# Patient Record
Sex: Male | Born: 1970 | Race: White | Hispanic: No | Marital: Married | Smoking: Never smoker
Health system: Southern US, Community
[De-identification: ages and names within clinical notes are randomized; demographics above are authoritative.]

## PROBLEM LIST (undated history)

## (undated) DIAGNOSIS — S42309A Unspecified fracture of shaft of humerus, unspecified arm, initial encounter for closed fracture: Secondary | ICD-10-CM

## (undated) DIAGNOSIS — T7840XA Allergy, unspecified, initial encounter: Secondary | ICD-10-CM

## (undated) DIAGNOSIS — J302 Other seasonal allergic rhinitis: Secondary | ICD-10-CM

## (undated) HISTORY — DX: Allergy, unspecified, initial encounter: T78.40XA

## (undated) HISTORY — DX: Unspecified fracture of shaft of humerus, unspecified arm, initial encounter for closed fracture: S42.309A

## (undated) HISTORY — DX: Other seasonal allergic rhinitis: J30.2

## (undated) HISTORY — PX: TONSILLECTOMY: SUR1361

---
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
## 1992-07-31 HISTORY — PX: RHINOPLASTY: SUR1284

## 2002-07-31 HISTORY — PX: VASECTOMY: SHX75

## 2012-07-26 ENCOUNTER — Ambulatory Visit: Payer: Self-pay | Admitting: Internal Medicine

## 2012-08-23 ENCOUNTER — Ambulatory Visit (INDEPENDENT_AMBULATORY_CARE_PROVIDER_SITE_OTHER): Payer: BC Managed Care – PPO | Admitting: Internal Medicine

## 2012-08-23 ENCOUNTER — Encounter: Payer: Self-pay | Admitting: Internal Medicine

## 2012-08-23 VITALS — BP 130/82 | HR 72 | Temp 98.2°F | Resp 18 | Ht 65.5 in | Wt 208.0 lb

## 2012-08-23 DIAGNOSIS — M25561 Pain in right knee: Secondary | ICD-10-CM

## 2012-08-23 DIAGNOSIS — Z0000 Encounter for general adult medical examination without abnormal findings: Secondary | ICD-10-CM

## 2012-08-23 DIAGNOSIS — R0609 Other forms of dyspnea: Secondary | ICD-10-CM

## 2012-08-23 DIAGNOSIS — M25569 Pain in unspecified knee: Secondary | ICD-10-CM

## 2012-08-23 DIAGNOSIS — R0683 Snoring: Secondary | ICD-10-CM | POA: Insufficient documentation

## 2012-08-23 DIAGNOSIS — R0989 Other specified symptoms and signs involving the circulatory and respiratory systems: Secondary | ICD-10-CM

## 2012-08-23 NOTE — Assessment & Plan Note (Signed)
Right lateral knee pain most consistent with IT band syndrome. Symptoms improving with massage and rest. Will continue to monitor. Pt will email with update. If no improvement, then will set up sports med eval.

## 2012-08-23 NOTE — Assessment & Plan Note (Signed)
Pt with extensive h/o nasal reconstruction, now with snoring during sleep. No daytime somnolence noted. Will set up sleep study for evaluation of sleep apnea.

## 2012-08-23 NOTE — Progress Notes (Signed)
Subjective:    Patient ID: Martin Lewis, male    DOB: 1970/08/28, 42 y.o.   MRN: [id number]  HPI 42 year old male presents to establish care. He reports he has been generally healthy. He has 2 concerns today. First, his wife has noticed force frequently when sleeping. He is unsure if he has apneic events. He denies any daytime somnolence however does note that he can fall asleep quickly during the day when at rest. He has never been evaluated for sleep apnea. He has a history of nasal reconstruction after nasal fracture.  He is also concerned about right lateral knee pain. Pain first began a couple of weeks ago. He notes he has been increasing his running workouts. He denies any specific injury to his knee. He is very active with running, basketball, and weights. pain is described as aching in the right lateral knee. It has improved with massage and topical anti-inflammatory medications. He denies any swelling in his leg, instability, weakness.   Outpatient Encounter Prescriptions as of 08/23/2012  Medication Sig Dispense Refill  . loratadine (CLARITIN) 10 MG tablet Take 10 mg by mouth daily.       BP 130/82  Pulse 72  Temp 98.2 F (36.8 C) (Oral)  Resp 18  Ht 5' 5.5" (1.664 m)  Wt 208 lb (94.348 kg)  BMI 34.09 kg/m2  SpO2 97%  Review of Systems  Constitutional: Negative for fever, chills, activity change, appetite change, fatigue and unexpected weight change.  Eyes: Negative for visual disturbance.  Respiratory: Negative for cough and shortness of breath.   Cardiovascular: Negative for chest pain, palpitations and leg swelling.  Gastrointestinal: Negative for abdominal pain and abdominal distention.  Genitourinary: Negative for dysuria, urgency and difficulty urinating.  Musculoskeletal: Positive for myalgias and arthralgias. Negative for gait problem.  Skin: Negative for color change and rash.  Hematological: Negative for adenopathy.  Psychiatric/Behavioral: Negative for sleep  disturbance and dysphoric mood. The patient is not nervous/anxious.        Objective:   Physical Exam  Constitutional: He is oriented to person, place, and time. He appears well-developed and well-nourished. No distress.  HENT:  Head: Normocephalic and atraumatic.  Right Ear: External ear normal.  Left Ear: External ear normal.  Nose: Nose normal.  Mouth/Throat: Oropharynx is clear and moist. No oropharyngeal exudate.  Eyes: Conjunctivae normal and EOM are normal. Pupils are equal, round, and reactive to light. Right eye exhibits no discharge. Left eye exhibits no discharge. No scleral icterus.  Neck: Normal range of motion. Neck supple. No tracheal deviation present. No thyromegaly present.  Cardiovascular: Normal rate, regular rhythm and normal heart sounds.  Exam reveals no gallop and no friction rub.   No murmur heard. Pulmonary/Chest: Effort normal and breath sounds normal. No respiratory distress. He has no wheezes. He has no rales. He exhibits no tenderness.  Abdominal: Soft. Bowel sounds are normal. He exhibits no distension. There is no tenderness.  Musculoskeletal: Normal range of motion. He exhibits no edema.       Right knee: He exhibits normal range of motion, no swelling, no LCL laxity, normal patellar mobility and no MCL laxity. tenderness found. Lateral joint line tenderness noted.       Legs: Lymphadenopathy:    He has no cervical adenopathy.  Neurological: He is alert and oriented to person, place, and time. No cranial nerve deficit. Coordination normal.  Skin: Skin is warm and dry. No rash noted. He is not diaphoretic. No erythema. No pallor.  Psychiatric: He  has a normal mood and affect. His behavior is normal. Judgment and thought content normal.          Assessment & Plan:

## 2012-08-26 ENCOUNTER — Other Ambulatory Visit (INDEPENDENT_AMBULATORY_CARE_PROVIDER_SITE_OTHER): Payer: BC Managed Care – PPO

## 2012-08-26 DIAGNOSIS — Z0000 Encounter for general adult medical examination without abnormal findings: Secondary | ICD-10-CM

## 2012-08-28 ENCOUNTER — Encounter: Payer: Self-pay | Admitting: Internal Medicine

## 2012-08-28 LAB — LIPID PANEL
Chol/HDL Ratio: 6.3 ratio units — ABNORMAL HIGH (ref 0.0–5.0)
HDL: 28 mg/dL — ABNORMAL LOW (ref >39)
LDL Calculated: 115 mg/dL — ABNORMAL HIGH (ref 0–99)
Triglycerides: 165 mg/dL — ABNORMAL HIGH (ref 0–149)
VLDL Cholesterol Cal: 33 mg/dL (ref 5–40)

## 2012-08-28 LAB — COMPREHENSIVE METABOLIC PANEL
ALT: 43 IU/L (ref 0–44)
AST: 29 IU/L (ref 0–40)
CO2: 24 mmol/L (ref 19–28)
Calcium: 9.1 mg/dL (ref 8.7–10.2)
Chloride: 101 mmol/L (ref 97–108)
Potassium: 3.9 mmol/L (ref 3.5–5.2)
Sodium: 139 mmol/L (ref 134–144)

## 2012-08-28 LAB — CBC WITH DIFFERENTIAL/PLATELET
Basophils Absolute: 0.1 10*3/uL (ref 0.0–0.2)
Basos: 1 % (ref 0–3)
Hemoglobin: 15.5 g/dL (ref 12.6–17.7)
Immature Grans (Abs): 0.1 10*3/uL (ref 0.0–0.1)
Lymphs: 37 % (ref 14–46)
MCHC: 34.8 g/dL (ref 31.5–35.7)
Monocytes: 6 % (ref 4–12)
Neutrophils Absolute: 2.6 10*3/uL (ref 1.4–7.0)
Neutrophils Relative %: 51 % (ref 40–74)
RBC: 5.07 x10E6/uL (ref 4.14–5.80)

## 2012-08-29 ENCOUNTER — Telehealth: Payer: Self-pay | Admitting: *Deleted

## 2012-08-29 NOTE — Telephone Encounter (Signed)
The a1c couldn't be added, it was threw [facility]

## 2012-08-29 NOTE — Telephone Encounter (Signed)
Can you please ask him to come back for an A1c?

## 2012-09-13 ENCOUNTER — Ambulatory Visit: Payer: BC Managed Care – PPO | Admitting: Internal Medicine

## 2012-09-14 ENCOUNTER — Other Ambulatory Visit: Payer: Self-pay

## 2012-09-17 ENCOUNTER — Encounter: Payer: Self-pay | Admitting: Internal Medicine

## 2012-09-17 ENCOUNTER — Ambulatory Visit (INDEPENDENT_AMBULATORY_CARE_PROVIDER_SITE_OTHER): Payer: BC Managed Care – PPO | Admitting: Internal Medicine

## 2012-09-17 VITALS — BP 128/78 | HR 68 | Temp 98.3°F | Ht 65.5 in | Wt 208.5 lb

## 2012-09-17 DIAGNOSIS — E669 Obesity, unspecified: Secondary | ICD-10-CM | POA: Insufficient documentation

## 2012-09-17 DIAGNOSIS — R7309 Other abnormal glucose: Secondary | ICD-10-CM

## 2012-09-17 DIAGNOSIS — E786 Lipoprotein deficiency: Secondary | ICD-10-CM

## 2012-09-17 DIAGNOSIS — R739 Hyperglycemia, unspecified: Secondary | ICD-10-CM | POA: Insufficient documentation

## 2012-09-17 NOTE — Patient Instructions (Signed)
[email or web address]

## 2012-09-17 NOTE — Assessment & Plan Note (Signed)
BG noted to be elevated on labs at 136, however not fasting. No h/o DM. Will check A1c with labs today.

## 2012-09-17 NOTE — Assessment & Plan Note (Signed)
Discussed recent cholesterol results showing low HDL and elevated triglycerides. Discussed metabolic syndrome. Recommended Mediterranean style diet and exercise 3x per week. Plan to repeat lipids in 1 year.

## 2012-09-17 NOTE — Assessment & Plan Note (Signed)
Body mass index is 34.16 kg/(m^2).  Discussed healthy nutrition, with foods high in fiber and low in saturated fat. Discussed goal of physical activity 3x per week. Encouraged keeping a food diary.

## 2012-09-17 NOTE — Progress Notes (Signed)
Subjective:    Patient ID: Martin Lewis, male    DOB: March 07, 1971, 42 y.o.   MRN: [id number]  HPI 42 year old male presents for followup after recent lab work showing elevated blood sugar. Blood sugar on recent labs was 136. Patient reports this was not fasting however. He has no personal history of diabetes. He reports he follows a relatively healthy diet. He gets regular physical activity. We also reviewed recent labs showing low HDL cholesterol and elevated triglycerides. He has never taken medication for cholesterol.  Reports he is generally feeling well. Planning to start improved nutrition program and keep a food diary. Notes some recent nasal congestion over last several days. No fever, chills, cough.  Outpatient Encounter Prescriptions as of 09/17/2012  Medication Sig Dispense Refill  . loratadine (CLARITIN) 10 MG tablet Take 10 mg by mouth daily.      . Multiple Vitamin (MULTIVITAMIN) tablet Take 1 tablet by mouth daily.      . NON FORMULARY [employer] shake, drink one daily with a scoop of protein       No facility-administered encounter medications on file as of 09/17/2012.   BP 128/78  Pulse 68  Temp(Src) 98.3 F (36.8 C) (Oral)  Ht 5' 5.5" (1.664 m)  Wt 208 lb 8 oz (94.575 kg)  BMI 34.16 kg/m2  SpO2 97%  Review of Systems  Constitutional: Negative for fever, chills, activity change, appetite change, fatigue and unexpected weight change.  HENT: Positive for congestion.   Eyes: Negative for visual disturbance.  Respiratory: Negative for cough and shortness of breath.   Cardiovascular: Negative for chest pain, palpitations and leg swelling.  Gastrointestinal: Negative for abdominal pain and abdominal distention.  Genitourinary: Negative for dysuria, urgency and difficulty urinating.  Musculoskeletal: Negative for arthralgias and gait problem.  Skin: Negative for color change and rash.  Hematological: Negative for adenopathy.  Psychiatric/Behavioral: Negative for sleep  disturbance and dysphoric mood. The patient is not nervous/anxious.        Objective:   Physical Exam  Constitutional: He is oriented to person, place, and time. He appears well-developed and well-nourished. No distress.  HENT:  Head: Normocephalic and atraumatic.  Right Ear: External ear normal. Tympanic membrane is not erythematous and not bulging. A middle ear effusion is present.  Left Ear: External ear normal. Tympanic membrane is not erythematous and not bulging. A middle ear effusion is present.  Nose: Nose normal.  Mouth/Throat: Oropharynx is clear and moist. No oropharyngeal exudate.  Eyes: Conjunctivae and EOM are normal. Pupils are equal, round, and reactive to light. Right eye exhibits no discharge. Left eye exhibits no discharge. No scleral icterus.  Neck: Normal range of motion. Neck supple. No tracheal deviation present. No thyromegaly present.  Cardiovascular: Normal rate, regular rhythm and normal heart sounds.  Exam reveals no gallop and no friction rub.   No murmur heard. Pulmonary/Chest: Effort normal and breath sounds normal. No respiratory distress. He has no wheezes. He has no rales. He exhibits no tenderness.  Musculoskeletal: Normal range of motion. He exhibits no edema.  Lymphadenopathy:    He has no cervical adenopathy.  Neurological: He is alert and oriented to person, place, and time. No cranial nerve deficit. Coordination normal.  Skin: Skin is warm and dry. No rash noted. He is not diaphoretic. No erythema. No pallor.  Psychiatric: He has a normal mood and affect. His behavior is normal. Judgment and thought content normal.          Assessment & Plan:

## 2012-09-18 LAB — HEMOGLOBIN A1C
Est. average glucose Bld gHb Est-mCnc: 103 mg/dL
Hgb A1c MFr Bld: 5.2 % (ref 4.8–5.6)

## 2012-09-28 ENCOUNTER — Ambulatory Visit: Payer: Self-pay | Admitting: Family Medicine

## 2013-06-05 ENCOUNTER — Other Ambulatory Visit: Payer: Self-pay
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
## 2015-05-24 ENCOUNTER — Encounter: Payer: Self-pay | Admitting: Family Medicine

## 2015-05-24 ENCOUNTER — Ambulatory Visit (INDEPENDENT_AMBULATORY_CARE_PROVIDER_SITE_OTHER): Payer: 59 | Admitting: Family Medicine

## 2015-05-24 VITALS — BP 122/82 | HR 75 | Temp 98.5°F | Ht 65.5 in | Wt 206.0 lb

## 2015-05-24 DIAGNOSIS — Z0000 Encounter for general adult medical examination without abnormal findings: Secondary | ICD-10-CM | POA: Diagnosis not present

## 2015-05-24 DIAGNOSIS — E786 Lipoprotein deficiency: Secondary | ICD-10-CM

## 2015-05-24 DIAGNOSIS — R0683 Snoring: Secondary | ICD-10-CM

## 2015-05-24 DIAGNOSIS — Z23 Encounter for immunization: Secondary | ICD-10-CM

## 2015-05-24 DIAGNOSIS — E669 Obesity, unspecified: Secondary | ICD-10-CM

## 2015-05-24 NOTE — Assessment & Plan Note (Signed)
Patient doing well overall. His BMI places him in the obese category. Suspect some of this is due to his muscular build. His blood pressure is well-controlled today. He had recent blood work through [facility]. that revealed a low HDL minimally elevated LDL, and other normal cholesterol labs. His A1c was 5.1. He does not smoke. He drinks infrequent alcohol. He has had an HIV test done previously that was negative. He is up-to-date on his Tdap. He will get a flu shot today. He will work on diet and exercises discussed and the obesity problem. We will obtain a CMET.

## 2015-05-24 NOTE — Assessment & Plan Note (Signed)
Patient notes continued snoring, he does have a history of nasal fracture in the past. No daytime somnolence or hypersomnia. No witnessed apneic episodes. Discussed sleep study versus allergy treatment versus monitoring and patient noted that he may try over-the-counter allergy medication for this. He declines sleep study at this time.

## 2015-05-24 NOTE — Progress Notes (Signed)
Patient ID: Martin Lewis, male   DOB: 1971-05-27, 44 y.o.   MRN: [id number]  Tommi Rumps, MD Phone: [phone]  Martin Lewis is a 44 y.o. male who presents today for yearly physical.  Obesity: Patient with BMI of 33. He recently had his health evaluation through [facility]. He notes he was advised that he needed to lose weight. He notes he has changed his diet since this evaluation. He is now eating 5-6 small meals a day. He typically has scrambled eggs or oatmeal for breakfast and eats within one hour of waking up. He typically has a protein shake around 10 AM. He eats lunch out due to the nature of his job he typically eats salads or grilled chicken wraps. For dinner he typically has a protein which is most of the time chicken, though could be steak or hamburger one time a week, and eventually. He drinks 1-2 sodas a week. He does drink sweet tea most days out of the week. He goes to the gym 2-3 days a week and lifts weights. He does body weight exercises the other days out of the week. He is very active at work and typically walks 5 miles per day. He notes he played college sports.  Snoring: Patient notes he has snored for a long time. This is an intermittent issue. Does not occur every night. He has never been told he has an apneic episode. He notes he wakes up well rested. He does not fall asleep easily in front of the TV or at a traffic light. He does not have any shortness of breath. He has never had a sleep study.  Active Ambulatory Problems    Diagnosis Date Noted  . Snoring 08/23/2012  . Elevated blood sugar 09/17/2012  . Obesity (BMI 30-39.9) 09/17/2012  . Low HDL (under 40) 09/17/2012  . Physical exam, annual 05/24/2015   Resolved Ambulatory Problems    Diagnosis Date Noted  . Right knee pain 08/23/2012   Past Medical History  Diagnosis Date  . Seasonal allergies   . Humerus fracture     Family History  Problem Relation Age of Onset  . Hypertension Father   . Heart  disease Paternal Grandfather   . Diabetes Paternal Grandfather   Notes a family history of unknown cancer in his grandfather when his grandfather was in his late 1s. He denies any family history of colon and prostate cancer.  Social History   Social History  . Marital Status: Married    Spouse Name: N/A  . Number of Children: N/A  . Years of Education: N/A   Occupational History  . Not on file.   Social History Main Topics  . Smoking status: Never Smoker   . Smokeless tobacco: Never Used  . Alcohol Use: Yes     Comment: occasional  . Drug Use: No  . Sexual Activity: Not on file   Other Topics Concern  . Not on file   Social History Narrative   Lives in [place] with wife.   Former [employer].      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 CV:  Negative for chest pain, dyspnea, edema, palpitations Resp: Negative for cough, dyspnea, hemoptysis GI: Negative for nausea, vomiting, diarrhea, constipation, abdominal pain, melena, hematochezia. GU: Negative for dysuria, incontinence, urinary hesitance, hematuria, vaginal or penile discharge, polyuria, sexual difficulty, lumps in testicle or breasts MSK: Negative for muscle cramps or aches, joint pain or swelling Neuro: Negative for headaches, weakness, numbness, dizziness, passing out/fainting Psych: Negative for depression, anxiety, memory problems  Objective  Physical Exam Filed Vitals:   05/24/15 1528  BP: 122/82  Pulse: 75  Temp: 98.5 F (36.9 C)    Physical Exam  Constitutional: He is well-developed, well-nourished, and in no distress.  HENT:  Head: Normocephalic and  atraumatic.  Right Ear: External ear normal.  Left Ear: External ear normal.  Mouth/Throat: Oropharynx is clear and moist. No oropharyngeal exudate.  Eyes: Conjunctivae are normal. Pupils are equal, round, and reactive to light.  Neck: Neck supple.  Cardiovascular: Normal rate, regular rhythm and normal heart sounds.  Exam reveals no gallop and no friction rub.   No murmur heard. Pulmonary/Chest: Effort normal and breath sounds normal. No respiratory distress. He has no wheezes. He has no rales.  Abdominal: Soft. Bowel sounds are normal. He exhibits no distension. There is no tenderness. There is no rebound and no guarding.  Musculoskeletal: He exhibits no edema.  Lymphadenopathy:    He has no cervical adenopathy.  Neurological: He is alert. Gait normal.  Skin: Skin is warm and dry. He is not diaphoretic.  Psychiatric: Mood and affect normal.     Assessment/Plan:   Obesity (BMI 30-39.9) Body mass index is 33.75 kg/(m^2). Discussed diet and exercise with patient. Patient will cut down on sweet tea and soda. He will start packing lunch for work most days out of the week. He will continue to exercise daily. He was given diet instructions in his AVS. His lab work from [facility]. will be scanned into the computer. We will obtain CMET day.  Snoring Patient notes continued snoring, he does have a history of nasal fracture in the past. No daytime somnolence or hypersomnia. No witnessed apneic episodes. Discussed sleep study versus allergy treatment versus monitoring and patient noted that he may try over-the-counter allergy medication for this. He declines sleep study at this time.  Low HDL (under 40) HDL remains low. Triglycerides are in the normal range now. Patient will work on diet and exercise for this.  Physical exam, annual Patient doing well overall. His BMI places him in the obese category. Suspect some of this is due to his muscular build. His blood pressure is well-controlled  today. He had recent blood work through [facility]. that revealed a low HDL minimally elevated LDL, and other normal cholesterol labs. His A1c was 5.1. He does not smoke. He drinks infrequent alcohol. He has had an HIV test done previously that was negative. He is up-to-date on his Tdap. He will get a flu shot today. He will work on diet and exercises discussed and the obesity problem. We will obtain a CMET.  Had discussion with patient about prostate cancer screening and patient opted to not proceed with this.  Orders Placed This Encounter  Procedures  . Flu Vaccine QUAD 36+ mos IM  . Comp Met (CMET)    Tommi Rumps

## 2015-05-24 NOTE — Progress Notes (Signed)
Pre visit review using our clinic review tool, if applicable. No additional management support is needed unless otherwise documented below in the visit note. 

## 2015-05-24 NOTE — Patient Instructions (Signed)
Nice to meet you. We will check your liver, kidneys, and electrolytes. Please continue to work on your diet and exercise. Please refrain from drinking sweet tea and soda. Please pack lunch for when you are at work.  Please exercise 4-5 days a week for 40 minutes a day.   Diet Recommendations  Starchy (carb) foods: Bread, rice, pasta, potatoes, corn, cereal, grits, crackers, bagels, muffins, all baked goods.  (Fruits, milk, and yogurt also have carbohydrate, but most of these foods will not spike your blood sugar as the starchy foods will.)  A few fruits do cause high blood sugars; use small portions of bananas (limit to 1/2 at a time), grapes, watermelon, oranges, and most tropical fruits.    Protein foods: Meat, fish, poultry, eggs, dairy foods, and beans such as pinto and kidney beans (beans also provide carbohydrate).   1. Eat at least 3 meals and 1-2 snacks per day. Never go more than 4-5 hours while awake without eating. Eat breakfast within the first hour of getting up.   2. Limit starchy foods to TWO per meal and ONE per snack. ONE portion of a starchy  food is equal to the following:   - ONE slice of bread (or its equivalent, such as half of a hamburger bun).   - 1/2 cup of a "scoopable" starchy food such as potatoes or rice.   - 15 grams of carbohydrate as shown on food label.  3. Include at every meal: a protein food, a carb food, and vegetables and/or fruit.   - Obtain twice the volume of veg's as protein or carbohydrate foods for both lunch and dinner.   - Fresh or frozen veg's are best.   - Keep frozen veg's on hand for a quick vegetable serving.

## 2015-05-24 NOTE — Assessment & Plan Note (Signed)
HDL remains low. Triglycerides are in the normal range now. Patient will work on diet and exercise for this.

## 2015-05-24 NOTE — Assessment & Plan Note (Addendum)
Body mass index is 33.75 kg/(m^2). Discussed diet and exercise with patient. Patient will cut down on sweet tea and soda. He will start packing lunch for work most days out of the week. He will continue to exercise daily. He was given diet instructions in his AVS. His lab work from [facility]. will be scanned into the computer. We will obtain CMET day.

## 2015-05-25 ENCOUNTER — Encounter: Payer: Self-pay | Admitting: Family Medicine

## 2015-05-25 LAB — COMPREHENSIVE METABOLIC PANEL
ALK PHOS: 40 U/L (ref 40–115)
ALT: 53 U/L — AB (ref 9–46)
AST: 28 U/L (ref 10–40)
Albumin: 4.3 g/dL (ref 3.6–5.1)
BUN: 12 mg/dL (ref 7–25)
CALCIUM: 9.1 mg/dL (ref 8.6–10.3)
CO2: 26 mmol/L (ref 20–31)
Chloride: 101 mmol/L (ref 98–110)
Creat: 0.92 mg/dL (ref 0.60–1.35)
GLUCOSE: 62 mg/dL — AB (ref 65–99)
POTASSIUM: 3.6 mmol/L (ref 3.5–5.3)
Sodium: 139 mmol/L (ref 135–146)
Total Bilirubin: 0.9 mg/dL (ref 0.2–1.2)
Total Protein: 6.6 g/dL (ref 6.1–8.1)

## 2015-06-02 ENCOUNTER — Other Ambulatory Visit: Payer: Self-pay | Admitting: Family Medicine

## 2015-06-02 DIAGNOSIS — R7989 Other specified abnormal findings of blood chemistry: Secondary | ICD-10-CM

## 2015-06-02 DIAGNOSIS — R945 Abnormal results of liver function studies: Principal | ICD-10-CM

## 2015-06-04 ENCOUNTER — Other Ambulatory Visit (INDEPENDENT_AMBULATORY_CARE_PROVIDER_SITE_OTHER): Payer: 59

## 2015-06-04 DIAGNOSIS — R7989 Other specified abnormal findings of blood chemistry: Secondary | ICD-10-CM

## 2015-06-04 DIAGNOSIS — R945 Abnormal results of liver function studies: Principal | ICD-10-CM

## 2015-06-05 LAB — COMPREHENSIVE METABOLIC PANEL
A/G RATIO: 1.8 (ref 1.1–2.5)
ALT: 42 IU/L (ref 0–44)
AST: 27 IU/L (ref 0–40)
Albumin: 4.6 g/dL (ref 3.5–5.5)
Alkaline Phosphatase: 48 IU/L (ref 39–117)
BUN/Creatinine Ratio: 16 (ref 9–20)
BUN: 13 mg/dL (ref 6–24)
Bilirubin Total: 0.6 mg/dL (ref 0.0–1.2)
CALCIUM: 9.4 mg/dL (ref 8.7–10.2)
CO2: 27 mmol/L (ref 18–29)
Chloride: 98 mmol/L (ref 97–106)
Creatinine, Ser: 0.83 mg/dL (ref 0.76–1.27)
GFR, EST AFRICAN AMERICAN: 125 mL/min/{1.73_m2} (ref >59)
GFR, EST NON AFRICAN AMERICAN: 108 mL/min/{1.73_m2} (ref >59)
GLOBULIN, TOTAL: 2.5 g/dL (ref 1.5–4.5)
Glucose: 91 mg/dL (ref 65–99)
POTASSIUM: 4.4 mmol/L (ref 3.5–5.2)
SODIUM: 140 mmol/L (ref 136–144)
TOTAL PROTEIN: 7.1 g/dL (ref 6.0–8.5)

## 2015-06-07 ENCOUNTER — Encounter: Payer: Self-pay | Admitting: Family Medicine

## 2015-06-28 ENCOUNTER — Ambulatory Visit: Payer: 59 | Admitting: Family Medicine

## 2015-06-28 ENCOUNTER — Telehealth: Payer: Self-pay

## 2015-06-28 NOTE — Telephone Encounter (Signed)
FYI, Pt called about not able to make come in for his appointment due to his insurance having a lapse of coverage. I did advise pt of his $50 cancellation fee. Appt is still on schedule. Thank You!

## 2015-06-28 NOTE — Telephone Encounter (Signed)
What appointment?

## 2020-08-02 ENCOUNTER — Other Ambulatory Visit: Payer: BC Managed Care – PPO

## 2020-08-02 DIAGNOSIS — Z20822 Contact with and (suspected) exposure to covid-19: Secondary | ICD-10-CM

## 2020-08-03 LAB — NOVEL CORONAVIRUS, NAA: SARS-CoV-2, NAA: DETECTED — AB

## 2020-08-03 LAB — SARS-COV-2, NAA 2 DAY TAT

## 2021-12-05 ENCOUNTER — Encounter: Payer: Self-pay | Admitting: Nurse Practitioner

## 2021-12-05 ENCOUNTER — Ambulatory Visit: Payer: BC Managed Care – PPO | Admitting: Nurse Practitioner

## 2021-12-05 VITALS — BP 170/94 | HR 77 | Temp 97.7°F | Resp 16 | Ht 66.0 in | Wt 210.0 lb

## 2021-12-05 DIAGNOSIS — Z23 Encounter for immunization: Secondary | ICD-10-CM

## 2021-12-05 DIAGNOSIS — Z0000 Encounter for general adult medical examination without abnormal findings: Secondary | ICD-10-CM | POA: Diagnosis not present

## 2021-12-05 DIAGNOSIS — Z6833 Body mass index (BMI) 33.0-33.9, adult: Secondary | ICD-10-CM

## 2021-12-05 DIAGNOSIS — I10 Essential (primary) hypertension: Secondary | ICD-10-CM

## 2021-12-05 DIAGNOSIS — Z125 Encounter for screening for malignant neoplasm of prostate: Secondary | ICD-10-CM | POA: Diagnosis not present

## 2021-12-05 DIAGNOSIS — E6609 Other obesity due to excess calories: Secondary | ICD-10-CM

## 2021-12-05 DIAGNOSIS — Z1211 Encounter for screening for malignant neoplasm of colon: Secondary | ICD-10-CM | POA: Diagnosis not present

## 2021-12-05 HISTORY — DX: Essential (primary) hypertension: I10

## 2021-12-05 NOTE — Progress Notes (Signed)
? ?New Patient Office Visit ? ?Subjective   ? ?Patient ID: Martin Lewis, male    DOB: [birth year]  Age: 51 y.o. MRN: [id number] ? ?CC:  ?Chief Complaint  ?Patient presents with  ? Establish Care  ?  Previous PCP with [facility] ?  ? Annual Exam  ? ? ?HPI ?Martin Lewis presents to establish care ? ?for complete physical and follow up of chronic conditions. ? ?Immunizations: ?-Tetanus: needs updating ?-Influenza: sometimes he takes it.  ?-Covid-19: pfizer ?-Shingles:?  Discussed in office ?-Pneumonia:  NA ? ?-HPV: Aged out ? ?Diet: Fair diet. Goal is 3-6 meals a day. States will have 3 meals and 3 snacks. Will drink coffee and water and one diet drink ?Exercise: No regular exercise. Will lift weight ( 2-3 days a week at 30 mins)and walking (varies but averages around 5 days week min 30 mins) ? ?Eye exam: Completes annually. Glasses to read  ?Dental exam: Needs updating ? ? ?Colonoscopy: Ambulatory referral to [facility] ?Dexa: N/A ?PSA: Due ? ?Lung Cancer Screening: N/A ? ?Sleep: goes to bed around 10pm and gets up around 530. Feels rested most time. Snores  ? ?Outpatient Encounter Medications as of 12/05/2021  ?Medication Sig  ? Multiple Vitamin (MULTIVITAMIN) tablet Take 1 tablet by mouth daily.  ? NON FORMULARY Living Fuel shake, drink one daily with a scoop of protein  ? [DISCONTINUED] loratadine (CLARITIN) 10 MG tablet Take 10 mg by mouth daily.  ? ?No facility-administered encounter medications on file as of 12/05/2021.  ? ? ?Past Medical History:  ?Diagnosis Date  ? Elevated blood pressure reading in office with diagnosis of hypertension 12/05/2021  ? Humerus fracture   ? right  ? Seasonal allergies   ? ? ?Past Surgical History:  ?Procedure Laterality Date  ? RHINOPLASTY  07/31/1992  ? [place], [place]  ? TONSILECTOMY, ADENOIDECTOMY, BILATERAL MYRINGOTOMY AND TUBES  08/01/1979  ? VASECTOMY  07/31/2002  ? ? ?Family History  ?Problem Relation Age of Onset  ? Hypertension Father   ? Obesity  Father   ? Hypertension Brother   ? Obesity Brother   ? Heart disease Paternal Grandfather   ? Diabetes Paternal Grandfather   ? ? ?Social History  ? ?Socioeconomic History  ? Marital status: Married  ?  Spouse name: Not on file  ? Number of children: 1  ? Years of education: Not on file  ? Highest education level: Not on file  ?Occupational History  ? Not on file  ?Tobacco Use  ? Smoking status: Never  ? Smokeless tobacco: Never  ?Vaping Use  ? Vaping Use: Never used  ?Substance and Sexual Activity  ? Alcohol use: Yes  ?  Alcohol/week: 2.0 standard drinks  ?  Types: 2 Glasses of wine per week  ?  Comment: once week wine or liquor 2-3 at at time  ? Drug use: No  ? Sexual activity: Yes  ?  Birth control/protection: None  ?Other Topics Concern  ? Not on file  ?Social History Narrative  ? Lives in [place] with wife.Former [employer].Work - [employer]apingExercises - walks 4-5 days per week, runs 2-3 times per week, weight liftingDiet - regular  ?   ? 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 ?Social Determinants of Health  ? ?Financial Resource Strain: Not on file  ?Food Insecurity: Not on file  ?Transportation Needs: Not on file  ?Physical Activity: Not on file  ?Stress: Not on file  ?Social Connections: Not  on file  ?Intimate Partner Violence: Not on file  ? ? ?Review of Systems  ?Constitutional:  Negative for chills, fever and malaise/fatigue.  ?Respiratory:  Negative for cough and shortness of breath.   ?Cardiovascular:  Negative for chest pain and leg swelling.  ?Gastrointestinal:  Negative for diarrhea, nausea and vomiting.  ?     Bm daily  ?Genitourinary:  Negative for dysuria and hematuria.  ?     Nocturia intermittenty  ?Neurological:  Negative for dizziness, tingling, weakness and headaches.  ?Psychiatric/Behavioral:  Negative for hallucinations and suicidal ideas.   ? ?  ? ? ?Objective   ? ?BP (!) 170/94   Pulse 77   Temp 97.7 ?F (36.5 ?C) (Temporal)   Resp 16   Ht '5\' 6"'$  (1.676 m)   Wt  210 lb (95.3 kg)   SpO2 95%   BMI 33.89 kg/m?  ? ?Physical Exam ?Vitals and nursing note reviewed. Exam conducted with a chaperone present [facility], RMA).  ?Constitutional:   ?   Appearance: Normal appearance.  ?HENT:  ?   Right Ear: Tympanic membrane, ear canal and external ear normal.  ?   Left Ear: Tympanic membrane, ear canal and external ear normal.  ?   Mouth/Throat:  ?   Mouth: Mucous membranes are moist.  ?   Pharynx: Oropharynx is clear.  ?Eyes:  ?   Extraocular Movements: Extraocular movements intact.  ?   Pupils: Pupils are equal, round, and reactive to light.  ?   Comments: Reading glasses ?  ?Neck:  ?   Thyroid: No thyroid mass, thyromegaly or thyroid tenderness.  ?Cardiovascular:  ?   Rate and Rhythm: Normal rate and regular rhythm.  ?   Pulses: Normal pulses.  ?   Heart sounds: Normal heart sounds.  ?Pulmonary:  ?   Effort: Pulmonary effort is normal.  ?   Breath sounds: Normal breath sounds.  ?Abdominal:  ?   General: Bowel sounds are normal. There is no distension.  ?   Palpations: There is no mass.  ?   Tenderness: There is no abdominal tenderness.  ?   Hernia: No hernia is present. There is no hernia in the left inguinal area or right inguinal area.  ?Genitourinary: ?   Penis: Normal and circumcised.   ?   Testes: Normal.  ?   Epididymis:  ?   Right: Normal.  ?   Left: Normal.  ?Musculoskeletal:  ?   Right lower leg: No edema.  ?   Left lower leg: No edema.  ?Lymphadenopathy:  ?   Cervical: No cervical adenopathy.  ?   Lower Body: No right inguinal adenopathy. No left inguinal adenopathy.  ?Skin: ?   General: Skin is warm.  ?Neurological:  ?   General: No focal deficit present.  ?   Mental Status: He is alert.  ?   Deep Tendon Reflexes:  ?   Reflex Scores: ?     Bicep reflexes are 2+ on the right side and 2+ on the left side. ?     Patellar reflexes are 2+ on the right side and 2+ on the left side. ?   Comments: Bilateral upper and lower extremity strength 5/5  ?Psychiatric:     ?    Mood and Affect: Mood normal.     ?   Behavior: Behavior normal.     ?   Thought Content: Thought content normal.  ? ? ? ?  ? ?Assessment & Plan:  ? ?  Problem List Items Addressed This Visit   ? ?  ? Cardiovascular and Mediastinum  ? Elevated blood pressure reading in office with diagnosis of hypertension - Primary  ?  Patient had elevated blood pressure on initial check and recheck in office.  Patient has no history of high blood pressure looking at last note which was approximately 7 years ago.  Patient does have ability to check blood pressure at home he was encouraged to check blood pressure 3-4 times weekly at varying times of the day and report back to me via MyChart. ? ?  ?  ?  ? Other  ? Preventative health care  ?  Discussed age-appropriate immunizations and screening exams.  Did discuss shingles but did not administer in office.  Ambulatory referral placed for GI to start colonoscopies ? ?  ?  ? Relevant Orders  ? CBC with Differential/Platelet  ? Comprehensive metabolic panel  ? Hemoglobin A1c  ? Lipid panel  ? TSH  ? Class 1 obesity due to excess calories without serious comorbidity with body mass index (BMI) of 33.0 to 33.9 in adult  ?  Continue doing healthy lifestyle modifications ? ?  ?  ? Relevant Orders  ? Hemoglobin A1c  ? Lipid panel  ? TSH  ? ?Other Visit Diagnoses   ? ? Screening for prostate cancer      ? Relevant Orders  ? PSA  ? Screening for colon cancer      ? Relevant Orders  ? Ambulatory referral to Gastroenterology  ? Need for prophylactic vaccination against diphtheria and tetanus      ? Relevant Orders  ? Td : Tetanus/diphtheria >7yo Preservative  free  ? ?  ? ? ?Return in about 1 year (around 12/06/2022) for CPE and labs.  ? ?Romilda Garret, NP ? ? ?

## 2021-12-05 NOTE — Patient Instructions (Signed)
Nice to see you today ?I will be in touch with the lab tests ?Follow up with me in 1 year, sooner if you need me ?Check you blood pressure around 4 times a week, varying times of the day and record them. Send them to me via mychart ? ?

## 2021-12-05 NOTE — Assessment & Plan Note (Signed)
Discussed age-appropriate immunizations and screening exams.  Did discuss shingles but did not administer in office.  Ambulatory referral placed for GI to start colonoscopies ?

## 2021-12-05 NOTE — Assessment & Plan Note (Signed)
Continue doing healthy lifestyle modifications ?

## 2021-12-05 NOTE — Assessment & Plan Note (Signed)
Patient had elevated blood pressure on initial check and recheck in office.  Patient has no history of high blood pressure looking at last note which was approximately 7 years ago.  Patient does have ability to check blood pressure at home he was encouraged to check blood pressure 3-4 times weekly at varying times of the day and report back to me via MyChart. ?

## 2021-12-06 ENCOUNTER — Other Ambulatory Visit: Payer: Self-pay

## 2021-12-06 DIAGNOSIS — Z1211 Encounter for screening for malignant neoplasm of colon: Secondary | ICD-10-CM

## 2021-12-06 MED ORDER — NA SULFATE-K SULFATE-MG SULF 17.5-3.13-1.6 GM/177ML PO SOLN: 1.0000 | Freq: Once | ORAL | 0 refills | Status: AC

## 2021-12-06 NOTE — Progress Notes (Signed)
Gastroenterology Pre-Procedure Review ? ?Request Date: 01/20/2022 ?Requesting Physician: Dr. Marius Ditch ? ?PATIENT REVIEW QUESTIONS: The patient responded to the following health history questions as indicated:   ? ?1. Are you having any GI issues? no ?2. Do you have a personal history of Polyps? no ?3. Do you have a family history of Colon Cancer or Polyps? no ?4. Diabetes Mellitus? no ?5. Joint replacements in the past 12 months?no ?6. Major health problems in the past 3 months?no ?7. Any artificial heart valves, MVP, or defibrillator?no ?   ?MEDICATIONS & ALLERGIES:    ?Patient reports the following regarding taking any anticoagulation/antiplatelet therapy:   ?Plavix, Coumadin, Eliquis, Xarelto, Lovenox, Pradaxa, Brilinta, or Effient? no ?Aspirin? no ? ?Patient confirms/reports the following medications:  ?Current Outpatient Medications  ?Medication Sig Dispense Refill  ? Multiple Vitamin (MULTIVITAMIN) tablet Take 1 tablet by mouth daily.    ? NON FORMULARY Living Fuel shake, drink one daily with a scoop of protein    ? ?No current facility-administered medications for this visit.  ? ? ?Patient confirms/reports the following allergies:  ?No Known Allergies ? ?No orders of the defined types were placed in this encounter. ? ? ?AUTHORIZATION INFORMATION ?Primary Insurance: ?1D#: ?Group #: ? ?Secondary Insurance: ?1D#: ?Group #: ? ?SCHEDULE INFORMATION: ?Date: 01/20/2022 ?Time: ?[place] ? ?

## 2021-12-13 ENCOUNTER — Other Ambulatory Visit (INDEPENDENT_AMBULATORY_CARE_PROVIDER_SITE_OTHER): Payer: BC Managed Care – PPO

## 2021-12-13 DIAGNOSIS — E6609 Other obesity due to excess calories: Secondary | ICD-10-CM

## 2021-12-13 DIAGNOSIS — Z0000 Encounter for general adult medical examination without abnormal findings: Secondary | ICD-10-CM

## 2021-12-13 DIAGNOSIS — Z125 Encounter for screening for malignant neoplasm of prostate: Secondary | ICD-10-CM

## 2021-12-13 DIAGNOSIS — Z6833 Body mass index (BMI) 33.0-33.9, adult: Secondary | ICD-10-CM

## 2021-12-13 LAB — COMPREHENSIVE METABOLIC PANEL
ALT: 24 U/L (ref 0–53)
AST: 19 U/L (ref 0–37)
Albumin: 4.7 g/dL (ref 3.5–5.2)
Alkaline Phosphatase: 41 U/L (ref 39–117)
BUN: 17 mg/dL (ref 6–23)
CO2: 29 mEq/L (ref 19–32)
Calcium: 9.5 mg/dL (ref 8.4–10.5)
Chloride: 104 mEq/L (ref 96–112)
Creatinine, Ser: 0.97 mg/dL (ref 0.40–1.50)
GFR: 91.2 mL/min (ref >60.00)
Glucose, Bld: 117 mg/dL — ABNORMAL HIGH (ref 70–99)
Potassium: 4.3 mEq/L (ref 3.5–5.1)
Sodium: 140 mEq/L (ref 135–145)
Total Bilirubin: 0.7 mg/dL (ref 0.2–1.2)
Total Protein: 6.8 g/dL (ref 6.0–8.3)

## 2021-12-13 LAB — LIPID PANEL
Cholesterol: 218 mg/dL — ABNORMAL HIGH (ref 0–200)
HDL: 39.4 mg/dL (ref >39.00)
LDL Cholesterol: 155 mg/dL — ABNORMAL HIGH (ref 0–99)
NonHDL: 178.53
Total CHOL/HDL Ratio: 6
Triglycerides: 120 mg/dL (ref 0.0–149.0)
VLDL: 24 mg/dL (ref 0.0–40.0)

## 2021-12-13 LAB — CBC WITH DIFFERENTIAL/PLATELET
Basophils Absolute: 0.1 10*3/uL (ref 0.0–0.1)
Basophils Relative: 1.1 % (ref 0.0–3.0)
Eosinophils Absolute: 0.1 10*3/uL (ref 0.0–0.7)
Eosinophils Relative: 2.2 % (ref 0.0–5.0)
HCT: 44.4 % (ref 39.0–52.0)
Hemoglobin: 15.8 g/dL (ref 13.0–17.0)
Lymphocytes Relative: 26.8 % (ref 12.0–46.0)
Lymphs Abs: 1.4 10*3/uL (ref 0.7–4.0)
MCHC: 35.6 g/dL (ref 30.0–36.0)
MCV: 87.7 fl (ref 78.0–100.0)
Monocytes Absolute: 0.4 10*3/uL (ref 0.1–1.0)
Monocytes Relative: 7.8 % (ref 3.0–12.0)
Neutro Abs: 3.2 10*3/uL (ref 1.4–7.7)
Neutrophils Relative %: 62.1 % (ref 43.0–77.0)
Platelets: 188 10*3/uL (ref 150.0–400.0)
RBC: 5.06 Mil/uL (ref 4.22–5.81)
RDW: 12.8 % (ref 11.5–15.5)
WBC: 5.1 10*3/uL (ref 4.0–10.5)

## 2021-12-13 LAB — HEMOGLOBIN A1C: Hgb A1c MFr Bld: 5 % (ref 4.6–6.5)

## 2021-12-13 LAB — TSH: TSH: 1.04 u[IU]/mL (ref 0.35–5.50)

## 2021-12-13 LAB — PSA: PSA: 0.13 ng/mL (ref 0.10–4.00)

## 2022-03-09 ENCOUNTER — Encounter: Payer: Self-pay | Admitting: Gastroenterology

## 2022-03-10 ENCOUNTER — Ambulatory Visit: Payer: BC Managed Care – PPO | Admitting: General Practice

## 2022-03-10 ENCOUNTER — Encounter: Payer: Self-pay | Admitting: Gastroenterology

## 2022-03-10 ENCOUNTER — Ambulatory Visit
Admission: RE | Admit: 2022-03-10 | Discharge: 2022-03-10 | Disposition: A | Discharge status: Home / Self Care | Payer: BC Managed Care – PPO | Attending: Gastroenterology | Admitting: Gastroenterology

## 2022-03-10 ENCOUNTER — Encounter
Admission: RE | Disposition: A | Discharge status: Home / Self Care | Payer: Self-pay | Source: Home / Self Care | Attending: Gastroenterology

## 2022-03-10 DIAGNOSIS — K635 Polyp of colon: Secondary | ICD-10-CM

## 2022-03-10 DIAGNOSIS — K5730 Diverticulosis of large intestine without perforation or abscess without bleeding: Secondary | ICD-10-CM | POA: Diagnosis not present

## 2022-03-10 DIAGNOSIS — Z1211 Encounter for screening for malignant neoplasm of colon: Secondary | ICD-10-CM | POA: Diagnosis not present

## 2022-03-10 DIAGNOSIS — K644 Residual hemorrhoidal skin tags: Secondary | ICD-10-CM | POA: Diagnosis not present

## 2022-03-10 DIAGNOSIS — D124 Benign neoplasm of descending colon: Secondary | ICD-10-CM | POA: Diagnosis not present

## 2022-03-10 DIAGNOSIS — D122 Benign neoplasm of ascending colon: Secondary | ICD-10-CM | POA: Insufficient documentation

## 2022-03-10 HISTORY — PX: COLONOSCOPY WITH PROPOFOL: SHX5780

## 2022-03-10 SURGERY — COLONOSCOPY WITH PROPOFOL
Anesthesia: General

## 2022-03-10 MED ORDER — MIDAZOLAM HCL 2 MG/2ML IJ SOLN
INTRAMUSCULAR | Status: AC
  Filled 2022-03-10: qty 2

## 2022-03-10 MED ORDER — SODIUM CHLORIDE 0.9 % IV SOLN
INTRAVENOUS | Status: DC
  Administered 2022-03-10: 20 mL/h via INTRAVENOUS

## 2022-03-10 MED ORDER — PROPOFOL 10 MG/ML IV BOLUS
INTRAVENOUS | Status: DC | PRN
  Administered 2022-03-10: 100 mg via INTRAVENOUS

## 2022-03-10 MED ORDER — LIDOCAINE HCL (CARDIAC) PF 100 MG/5ML IV SOSY
PREFILLED_SYRINGE | INTRAVENOUS | Status: DC | PRN
  Administered 2022-03-10: 100 mg via INTRAVENOUS

## 2022-03-10 MED ORDER — PROPOFOL 500 MG/50ML IV EMUL
INTRAVENOUS | Status: DC | PRN
  Administered 2022-03-10: 150 ug/kg/min via INTRAVENOUS

## 2022-03-10 NOTE — H&P (Signed)
Martin Darby, MD [address]  [place]  [place], [place] [address]  Main: [phone]  Fax: [phone] Pager: [phone]  Primary Care Physician:  Michela Pitcher, NP Primary Gastroenterologist:  Dr. Cephas Lewis  Pre-Procedure History & Physical: HPI:  Martin Lewis is a 51 y.o. male is here for an colonoscopy.   Past Medical History:  Diagnosis Date   Elevated blood pressure reading in office with diagnosis of hypertension 12/05/2021   Humerus fracture    right   Seasonal allergies     Past Surgical History:  Procedure Laterality Date   RHINOPLASTY  07/31/1992   [place], [place]   TONSILECTOMY, ADENOIDECTOMY, BILATERAL MYRINGOTOMY AND TUBES  08/01/1979   TONSILLECTOMY     VASECTOMY  07/31/2002    Prior to Admission medications   Medication Sig Start Date End Date Taking? Authorizing Provider  Multiple Vitamin (MULTIVITAMIN) tablet Take 1 tablet by mouth daily.    [provider]  NON FORMULARY Living Fuel shake, drink one daily with a scoop of protein    [provider]    Allergies as of 12/06/2021   (No Known Allergies)    Family History  Problem Relation Age of Onset   Hypertension Father    Obesity Father    Hypertension Brother    Obesity Brother    Heart disease Paternal Grandfather    Diabetes Paternal Grandfather     Social History   Socioeconomic History   Marital status: Married    Spouse name: Not on file   Number of children: 1   Years of education: Not on file   Highest education level: Not on file  Occupational History   Not on file  Tobacco Use   Smoking status: Never   Smokeless tobacco: Never  Vaping Use   Vaping Use: Never used  Substance and Sexual Activity   Alcohol use: Yes    Alcohol/week: 2.0 standard drinks of alcohol    Types: 2 Glasses of wine per week    Comment: once week wine or liquor 2-3 at at time   Drug use: No   Sexual activity: Yes    Birth control/protection: None   Other Topics Concern   Not on file  Social History Narrative   Lives in [place] with wife.Former [employer].Work - [employer]ercises - walks 4-5 days per week, runs 2-3 times per week, weight liftingDiet - regular      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 Neck:  Supple; no masses or thyromegaly. Lungs:  Clear throughout to auscultation.    Heart:  Regular rate and rhythm. Abdomen:  Soft, nontender and nondistended. Normal bowel sounds, without guarding, and without rebound.   Neurologic:  Alert and  oriented x4;  grossly normal neurologically.  Impression/Plan: [name] is here for an colonoscopy to be performed for colon cancer screening  Risks, benefits, limitations, and alternatives regarding  colonoscopy have been reviewed with the patient.  Questions have been answered.  All parties agreeable.   Sherri Sear, MD  03/10/2022, 10:51 AM

## 2022-03-10 NOTE — Op Note (Signed)
[facility] Gastroenterology Patient Name: Martin Lewis Procedure Date: 03/10/2022 10:21 AM MRN: [id number] Account #: [id number] Date of Birth: 1971-04-18 Admit Type: Outpatient Age: 51 Room: [facility] ENDO ROOM 2 Gender: Male Note Status: Finalized Instrument Name: Jasper Riling [id number] Procedure:             Colonoscopy Indications:           Screening for colorectal malignant neoplasm, This is                         the patient's first colonoscopy Providers:             Lin Landsman MD, MD Referring MD:          Alyson Locket. Charmian Muff (Referring MD) Medicines:             General Anesthesia Complications:         No immediate complications. Estimated blood loss: None. Procedure:             Pre-Anesthesia Assessment:                        - Prior to the procedure, a History and Physical was                         performed, and patient medications and allergies were                         reviewed. The patient is competent. The risks and                         benefits of the procedure and the sedation options and                         risks were discussed with the patient. All questions                         were answered and informed consent was obtained.                         Patient identification and proposed procedure were                         verified by the physician, the nurse, the                         anesthesiologist, the anesthetist and the technician                         in the pre-procedure area in the procedure room in the                         endoscopy suite. Mental Status Examination: alert and                         oriented. Airway Examination: normal oropharyngeal                         airway and neck mobility. Respiratory Examination:  clear to auscultation. CV Examination: normal.                         Prophylactic Antibiotics: The patient does not require                         prophylactic  antibiotics. Prior Anticoagulants: The                         patient has taken no previous anticoagulant or                         antiplatelet agents. ASA Grade Assessment: II - A                         patient with mild systemic disease. After reviewing                         the risks and benefits, the patient was deemed in                         satisfactory condition to undergo the procedure. The                         anesthesia plan was to use general anesthesia.                         Immediately prior to administration of medications,                         the patient was re-assessed for adequacy to receive                         sedatives. The heart rate, respiratory rate, oxygen                         saturations, blood pressure, adequacy of pulmonary                         ventilation, and response to care were monitored                         throughout the procedure. The physical status of the                         patient was re-assessed after the procedure.                        After obtaining informed consent, the colonoscope was                         passed under direct vision. Throughout the procedure,                         the patient's blood pressure, pulse, and oxygen                         saturations were monitored continuously. The  Colonoscope was introduced through the anus and                         advanced to the the terminal ileum, with                         identification of the appendiceal orifice and IC                         valve. The colonoscopy was performed without                         difficulty. The patient tolerated the procedure well.                         The quality of the bowel preparation was evaluated                         using the BBPS [facility] Bowel Preparation Scale) with                         scores of: Right Colon = 3, Transverse Colon = 3 and                         Left Colon =  3 (entire mucosa seen well with no                         residual staining, small fragments of stool or opaque                         liquid). The total BBPS score equals 9. Findings:      The perianal and digital rectal examinations were normal. Pertinent       negatives include normal sphincter tone and no palpable rectal lesions.      The terminal ileum appeared normal.      Three sessile polyps were found in the sigmoid colon, descending colon       and ascending colon. The polyps were 3 to 5 mm in size. These polyps       were removed with a cold snare. Resection and retrieval were complete.       Estimated blood loss: none.      A few small-mouthed diverticula were found in the recto-sigmoid colon       and sigmoid colon.      Non-bleeding external hemorrhoids were found during retroflexion. The       hemorrhoids were small. Impression:            - The examined portion of the ileum was normal.                        - Three 3 to 5 mm polyps in the sigmoid colon, in the                         descending colon and in the ascending colon, removed                         with a cold snare. Resected and retrieved.                        -  Diverticulosis in the recto-sigmoid colon and in the                         sigmoid colon.                        - Non-bleeding external hemorrhoids. Recommendation:        - Discharge patient to home (with escort).                        - Resume previous diet today.                        - Continue present medications.                        - Await pathology results.                        - Repeat colonoscopy in 5 to 7 years for surveillance                         based on pathology results. Procedure Code(s):     --- Professional ---                        [phone], Colonoscopy, flexible; with removal of                         tumor(s), polyp(s), or other lesion(s) by snare                         technique Diagnosis Code(s):     ---  Professional ---                        Z12.11, Encounter for screening for malignant neoplasm                         of colon                        K64.4, Residual hemorrhoidal skin tags                        K63.5, Polyp of colon                        K57.30, Diverticulosis of large intestine without                         perforation or abscess without bleeding CPT copyright 2019 American Medical Association. All rights reserved. The codes documented in this report are preliminary and upon coder review may  be revised to meet current compliance requirements. Dr. Ulyess Mort Lin Landsman MD, MD 03/10/2022 10:44:22 AM This report has been signed electronically. Number of Addenda: 0 Note Initiated On: 03/10/2022 10:21 AM Scope Withdrawal Time: 0 hours 13 minutes 39 seconds  Total Procedure Duration: 0 hours 16 minutes 23 seconds  Estimated Blood Loss:  Estimated blood loss: none.      [facility]

## 2022-03-10 NOTE — Anesthesia Preprocedure Evaluation (Signed)
Anesthesia Evaluation  Patient identified by MRN, date of birth, ID band Patient awake    Reviewed: Allergy & Precautions, NPO status , Patient's Chart, lab work & pertinent test results  Airway Mallampati: IV  TM Distance: >3 FB Neck ROM: full    Dental  (+) Teeth Intact   Pulmonary neg pulmonary ROS,    Pulmonary exam normal        Cardiovascular hypertension, negative cardio ROS Normal cardiovascular exam     Neuro/Psych negative neurological ROS  negative psych ROS   GI/Hepatic negative GI ROS, Neg liver ROS,   Endo/Other  negative endocrine ROS  Renal/GU negative Renal ROS  negative genitourinary   Musculoskeletal   Abdominal   Peds  Hematology negative hematology ROS (+)   Anesthesia Other Findings Past Medical History: 12/05/2021: Elevated blood pressure reading in office with diagnosis of  hypertension No date: Humerus fracture     Comment:  right No date: Seasonal allergies  Past Surgical History: 07/31/1992: RHINOPLASTY     Comment:  [place], [place] 08/01/1979: TONSILECTOMY, ADENOIDECTOMY, BILATERAL MYRINGOTOMY AND  TUBES No date: TONSILLECTOMY 07/31/2002: VASECTOMY  BMI    Body Mass Index: 31.32 kg/m      Reproductive/Obstetrics negative OB ROS                             Anesthesia Physical Anesthesia Plan  ASA: 2  Anesthesia Plan: General   Post-op Pain Management: Minimal or no pain anticipated   Induction: Intravenous  PONV Risk Score and Plan: 3 and Propofol infusion, TIVA and Ondansetron  Airway Management Planned: Nasal Cannula  Additional Equipment: None  Intra-op Plan:   Post-operative Plan:   Informed Consent: I have reviewed the patients History and Physical, chart, labs and discussed the procedure including the risks, benefits and alternatives for the proposed anesthesia with the patient or authorized representative who has  indicated his/her understanding and acceptance.     Dental advisory given  Plan Discussed with: CRNA and Surgeon  Anesthesia Plan Comments: (Discussed risks of anesthesia with patient, including possibility of difficulty with spontaneous ventilation under anesthesia necessitating airway intervention, PONV, and rare risks such as cardiac or respiratory or neurological events, and allergic reactions. Discussed the role of CRNA in patient's perioperative care. Patient understands.)        Anesthesia Quick Evaluation

## 2022-03-10 NOTE — Transfer of Care (Signed)
Immediate Anesthesia Transfer of Care Note  Patient: Martin Lewis  Procedure(s) Performed: COLONOSCOPY WITH PROPOFOL  Patient Location: PACU and Endoscopy Unit  Anesthesia Type:General  Level of Consciousness: drowsy  Airway & Oxygen Therapy: Patient Spontanous Breathing  Post-op Assessment: Report given to RN  Post vital signs: stable  Last Vitals:  Vitals Value Taken Time  BP 106/56 03/10/22 1046  Temp    Pulse 70 03/10/22 1046  Resp 16 03/10/22 1046  SpO2 94 % 03/10/22 1046  Vitals shown include unvalidated device data.  Last Pain:  Vitals:   03/10/22 1004  TempSrc: Temporal  PainSc: 0-No pain         Complications: No notable events documented.

## 2022-03-11 NOTE — Anesthesia Postprocedure Evaluation (Signed)
Anesthesia Post Note  Patient: Martin Lewis  Procedure(s) Performed: COLONOSCOPY WITH PROPOFOL  Patient location during evaluation: Endoscopy Anesthesia Type: General Level of consciousness: awake and alert Pain management: pain level controlled Vital Signs Assessment: post-procedure vital signs reviewed and stable Respiratory status: spontaneous breathing, nonlabored ventilation, respiratory function stable and patient connected to nasal cannula oxygen Cardiovascular status: blood pressure returned to baseline and stable Postop Assessment: no apparent nausea or vomiting Anesthetic complications: no   No notable events documented.   Last Vitals:  Vitals:   03/10/22 1056 03/10/22 1101  BP: (!) 100/57 (!) 102/23  Pulse: 74   Resp: 15   Temp: (!) 36.4 C (!) 36.4 C  SpO2: 96%     Last Pain:  Vitals:   03/10/22 1101  TempSrc:   PainSc: 0-No pain                 Dimas Millin

## 2022-03-13 LAB — SURGICAL PATHOLOGY

## 2022-03-23 ENCOUNTER — Encounter: Payer: Self-pay | Admitting: Gastroenterology

## 2022-07-18 ENCOUNTER — Telehealth: Payer: BC Managed Care – PPO | Admitting: Nurse Practitioner

## 2022-07-18 DIAGNOSIS — J069 Acute upper respiratory infection, unspecified: Secondary | ICD-10-CM

## 2022-07-18 MED ORDER — FLUTICASONE PROPIONATE 50 MCG/ACT NA SUSP: 2.0000 | Freq: Every day | NASAL | 6 refills | Status: AC

## 2022-07-18 MED ORDER — BENZONATATE 100 MG PO CAPS: 100.0000 mg | ORAL_CAPSULE | Freq: Three times a day (TID) | ORAL | 0 refills | Status: DC | PRN

## 2022-07-18 NOTE — Progress Notes (Signed)
E-Visit for Upper Respiratory Infection   We are sorry you are not feeling well.  Here is how we plan to help!  Based on what you have shared with me, it looks like you may have a viral upper respiratory infection.  Upper respiratory infections are caused by a large number of viruses; however, rhinovirus is the most common cause.   Symptoms vary from person to person, with common symptoms including sore throat, cough, fatigue or lack of energy and feeling of general discomfort.  A low-grade fever of up to 100.4 may present, but is often uncommon.  Symptoms vary however, and are closely related to a person's age or underlying illnesses.  The most common symptoms associated with an upper respiratory infection are nasal discharge or congestion, cough, sneezing, headache and pressure in the ears and face.  These symptoms usually persist for about 3 to 10 days, but can last up to 2 weeks.  It is important to know that upper respiratory infections do not cause serious illness or complications in most cases.    Upper respiratory infections can be transmitted from person to person, with the most common method of transmission being a person's hands.  The virus is able to live on the skin and can infect other persons for up to 2 hours after direct contact.  Also, these can be transmitted when someone coughs or sneezes; thus, it is important to cover the mouth to reduce this risk.  To keep the spread of the illness at bay, good hand hygiene is very important.  This is an infection that is most likely caused by a virus. There are no specific treatments other than to help you with the symptoms until the infection runs its course.  We are sorry you are not feeling well.  Here is how we plan to help!   For nasal congestion, you may use an oral decongestants such as Mucinex D or if you have glaucoma or high blood pressure use plain Mucinex.  Saline nasal spray or nasal drops can help and can safely be used as often as  needed for congestion.  For your congestion, I have prescribed Fluticasone nasal spray one spray in each nostril twice a day  If you do not have a history of heart disease, hypertension, diabetes or thyroid disease, prostate/bladder issues or glaucoma, you may also use Sudafed to treat nasal congestion.  It is highly recommended that you consult with a pharmacist or your primary care physician to ensure this medication is safe for you to take.     If you have a cough, you may use cough suppressants such as Delsym and Robitussin.  If you have glaucoma or high blood pressure, you can also use Coricidin HBP.   For cough I have prescribed for you A prescription cough medication called Tessalon Perles 100 mg. You may take 1-2 capsules every 8 hours as needed for cough  If you have a sore or scratchy throat, use a saltwater gargle-  to  teaspoon of salt dissolved in a 4-ounce to 8-ounce glass of warm water.  Gargle the solution for approximately 15-30 seconds and then spit.  It is important not to swallow the solution.  You can also use throat lozenges/cough drops and Chloraseptic spray to help with throat pain or discomfort.  Warm or cold liquids can also be helpful in relieving throat pain.  For headache, pain or general discomfort, you can use Ibuprofen or Tylenol as directed.   Some authorities believe   that zinc sprays or the use of Echinacea may shorten the course of your symptoms.   HOME CARE Only take medications as instructed by your medical team. Be sure to drink plenty of fluids. Water is fine as well as fruit juices, sodas and electrolyte beverages. You may want to stay away from caffeine or alcohol. If you are nauseated, try taking small sips of liquids. How do you know if you are getting enough fluid? Your urine should be a pale yellow or almost colorless. Get rest. Taking a steamy shower or using a humidifier may help nasal congestion and ease sore throat pain. You can place a towel over  your head and breathe in the steam from hot water coming from a faucet. Using a saline nasal spray works much the same way. Cough drops, hard candies and sore throat lozenges may ease your cough. Avoid close contacts especially the very young and the elderly Cover your mouth if you cough or sneeze Always remember to wash your hands.   GET HELP RIGHT AWAY IF: You develop worsening fever. If your symptoms do not improve within 10 days You develop yellow or green discharge from your nose over 3 days. You have coughing fits You develop a severe head ache or visual changes. You develop shortness of breath, difficulty breathing or start having chest pain Your symptoms persist after you have completed your treatment plan  MAKE SURE YOU  Understand these instructions. Will watch your condition. Will get help right away if you are not doing well or get worse.  Thank you for choosing an e-visit.  Your e-visit answers were reviewed by a board certified advanced clinical practitioner to complete your personal care plan. Depending upon the condition, your plan could have included both over the counter or prescription medications.  Please review your pharmacy choice. Make sure the pharmacy is open so you can pick up prescription now. If there is a problem, you may contact your provider through [employer] and have the prescription routed to another pharmacy.  Your safety is important to [place]. If you have drug allergies check your prescription carefully.   For the next 24 hours you can use MyChart to ask questions about today's visit, request a non-urgent call back, or ask for a work or school excuse. You will get an email in the next two days asking about your experience. I hope that your e-visit has been valuable and will speed your recovery.  Meds ordered this encounter  Medications   benzonatate (TESSALON) 100 MG capsule    Sig: Take 1 capsule (100 mg total) by mouth 3 (three) times daily  as needed.    Dispense:  30 capsule    Refill:  0   fluticasone (FLONASE) 50 MCG/ACT nasal spray    Sig: Place 2 sprays into both nostrils daily.    Dispense:  16 g    Refill:  6     I spent approximately 5 minutes reviewing the patient's history, current symptoms and coordinating their care today.

## 2022-07-21 ENCOUNTER — Telehealth: Payer: Self-pay | Admitting: Urgent Care

## 2022-07-21 DIAGNOSIS — J069 Acute upper respiratory infection, unspecified: Secondary | ICD-10-CM

## 2022-07-22 MED ORDER — PREDNISONE 10 MG (21) PO TBPK: ORAL_TABLET | Freq: Every day | ORAL | 0 refills | Status: DC

## 2022-07-22 MED ORDER — DM-GUAIFENESIN ER 30-600 MG PO TB12: 1.0000 | ORAL_TABLET | Freq: Two times a day (BID) | ORAL | 0 refills | Status: DC | PRN

## 2022-07-22 MED ORDER — ALBUTEROL SULFATE HFA 108 (90 BASE) MCG/ACT IN AERS: 2.0000 | INHALATION_SPRAY | Freq: Four times a day (QID) | RESPIRATORY_TRACT | 0 refills | Status: DC | PRN

## 2022-07-22 NOTE — Progress Notes (Signed)
We are sorry that you are not feeling well.  Here is how we plan to help!  Based on your presentation I believe you most likely have A cough due to a virus.  This is called viral bronchitis and is best treated by rest, plenty of fluids and control of the cough.  You may use Ibuprofen or Tylenol as directed to help your symptoms.     In addition you may use A non-prescription cough medication called Mucinex DM: take 2 tablets every 12 hours.  I have also called in a prednisone taper pack and an albuterol inhaler.  Directions for 6 day taper: Day 1: 2 tablets before breakfast, 1 after both lunch & dinner and 2 at bedtime Day 2: 1 tab before breakfast, 1 after both lunch & dinner and 2 at bedtime Day 3: 1 tab at each meal & 1 at bedtime Day 4: 1 tab at breakfast, 1 at lunch, 1 at bedtime Day 5: 1 tab at breakfast & 1 tab at bedtime Day 6: 1 tab at breakfast  From your responses in the eVisit questionnaire you describe inflammation in the upper respiratory tract which is causing a significant cough.  This is commonly called Bronchitis and has four common causes:   Allergies Viral Infections Acid Reflux Bacterial Infection Allergies, viruses and acid reflux are treated by controlling symptoms or eliminating the cause. An example might be a cough caused by taking certain blood pressure medications. You stop the cough by changing the medication. Another example might be a cough caused by acid reflux. Controlling the reflux helps control the cough.  USE OF BRONCHODILATOR ("RESCUE") INHALERS: There is a risk from using your bronchodilator too frequently.  The risk is that over-reliance on a medication which only relaxes the muscles surrounding the breathing tubes can reduce the effectiveness of medications prescribed to reduce swelling and congestion of the tubes themselves.  Although you feel brief relief from the bronchodilator inhaler, your asthma may actually be worsening with the tubes becoming  more swollen and filled with mucus.  This can delay other crucial treatments, such as oral steroid medications. If you need to use a bronchodilator inhaler daily, several times per day, you should discuss this with your provider.  There are probably better treatments that could be used to keep your asthma under control.     HOME CARE Only take medications as instructed by your medical team. Complete the entire course of an antibiotic. Drink plenty of fluids and get plenty of rest. Avoid close contacts especially the very young and the elderly Cover your mouth if you cough or cough into your sleeve. Always remember to wash your hands A steam or ultrasonic humidifier can help congestion.   GET HELP RIGHT AWAY IF: You develop worsening fever. You become short of breath You cough up blood. Your symptoms persist after you have completed your treatment plan MAKE SURE YOU  Understand these instructions. Will watch your condition. Will get help right away if you are not doing well or get worse.    Thank you for choosing an e-visit.  Your e-visit answers were reviewed by a board certified advanced clinical practitioner to complete your personal care plan. Depending upon the condition, your plan could have included both over the counter or prescription medications.  Please review your pharmacy choice. Make sure the pharmacy is open so you can pick up prescription now. If there is a problem, you may contact your provider through [employer] and have the  prescription routed to another pharmacy.  Your safety is important to [place]. If you have drug allergies check your prescription carefully.   For the next 24 hours you can use MyChart to ask questions about today's visit, request a non-urgent call back, or ask for a work or school excuse. You will get an email in the next two days asking about your experience. I hope that your e-visit has been valuable and will speed your recovery.   I have  spent 5 minutes in review of e-visit questionnaire, review and updating patient chart, medical decision making and response to patient.   [place], PA

## 2022-10-20 DIAGNOSIS — M546 Pain in thoracic spine: Secondary | ICD-10-CM | POA: Diagnosis not present

## 2022-10-20 DIAGNOSIS — M9901 Segmental and somatic dysfunction of cervical region: Secondary | ICD-10-CM | POA: Diagnosis not present

## 2022-10-20 DIAGNOSIS — M542 Cervicalgia: Secondary | ICD-10-CM | POA: Diagnosis not present

## 2022-10-20 DIAGNOSIS — M9902 Segmental and somatic dysfunction of thoracic region: Secondary | ICD-10-CM | POA: Diagnosis not present

## 2022-10-25 DIAGNOSIS — M9902 Segmental and somatic dysfunction of thoracic region: Secondary | ICD-10-CM | POA: Diagnosis not present

## 2022-10-25 DIAGNOSIS — M542 Cervicalgia: Secondary | ICD-10-CM | POA: Diagnosis not present

## 2022-10-25 DIAGNOSIS — M546 Pain in thoracic spine: Secondary | ICD-10-CM | POA: Diagnosis not present

## 2022-10-25 DIAGNOSIS — M9901 Segmental and somatic dysfunction of cervical region: Secondary | ICD-10-CM | POA: Diagnosis not present

## 2022-11-13 DIAGNOSIS — M9901 Segmental and somatic dysfunction of cervical region: Secondary | ICD-10-CM | POA: Diagnosis not present

## 2022-11-13 DIAGNOSIS — M9902 Segmental and somatic dysfunction of thoracic region: Secondary | ICD-10-CM | POA: Diagnosis not present

## 2022-11-13 DIAGNOSIS — M546 Pain in thoracic spine: Secondary | ICD-10-CM | POA: Diagnosis not present

## 2022-11-13 DIAGNOSIS — M542 Cervicalgia: Secondary | ICD-10-CM | POA: Diagnosis not present

## 2022-12-24 ENCOUNTER — Telehealth: Payer: Self-pay | Admitting: Physician Assistant

## 2022-12-24 DIAGNOSIS — J329 Chronic sinusitis, unspecified: Secondary | ICD-10-CM

## 2022-12-24 DIAGNOSIS — R051 Acute cough: Secondary | ICD-10-CM

## 2022-12-24 DIAGNOSIS — B9689 Other specified bacterial agents as the cause of diseases classified elsewhere: Secondary | ICD-10-CM

## 2022-12-24 MED ORDER — ALBUTEROL SULFATE HFA 108 (90 BASE) MCG/ACT IN AERS: 2.0000 | INHALATION_SPRAY | Freq: Four times a day (QID) | RESPIRATORY_TRACT | 0 refills | Status: DC | PRN

## 2022-12-24 MED ORDER — BENZONATATE 100 MG PO CAPS: 100.0000 mg | ORAL_CAPSULE | Freq: Three times a day (TID) | ORAL | 0 refills | Status: DC | PRN

## 2022-12-24 MED ORDER — AMOXICILLIN-POT CLAVULANATE 875-125 MG PO TABS: 1.0000 | ORAL_TABLET | Freq: Two times a day (BID) | ORAL | 0 refills | Status: AC

## 2022-12-24 NOTE — Progress Notes (Signed)
E-Visit for Sinus Problems  We are sorry that you are not feeling well.  Here is how we plan to help!  Based on what you have shared with me it looks like you have sinusitis.  Sinusitis is inflammation and infection in the sinus cavities of the head.  Based on your presentation I believe you most likely have Acute Bacterial Sinusitis.  This is an infection caused by bacteria and is treated with antibiotics. I have prescribed Augmentin 875mg /125mg  one tablet twice daily with food, for 7 days. You may use an oral decongestant such as Mucinex D or if you have glaucoma or high blood pressure use plain Mucinex. Saline nasal spray help and can safely be used as often as needed for congestion.  If you develop worsening sinus pain, fever or notice severe headache and vision changes, or if symptoms are not better after completion of antibiotic, please schedule an appointment with a health care provider.    Sinus infections are not as easily transmitted as other respiratory infection, however we still recommend that you avoid close contact with loved ones, especially the very young and elderly.  Remember to wash your hands thoroughly throughout the day as this is the number one way to prevent the spread of infection!  I have also sent in an inhaler for you to use for wheezing and shortness of breath and a cough medicine called Tessalon to take every 8 hours as needed. If this is providing no relief please be seen for in person evaluation at Urgent Care.   Home Care: Only take medications as instructed by your medical team. Complete the entire course of an antibiotic. Do not take these medications with alcohol. A steam or ultrasonic humidifier can help congestion.  You can place a towel over your head and breathe in the steam from hot water coming from a faucet. Avoid close contacts especially the very young and the elderly. Cover your mouth when you cough or sneeze. Always remember to wash your hands.  Get  Help Right Away If: You develop worsening fever or sinus pain. You develop a severe head ache or visual changes. Your symptoms persist after you have completed your treatment plan.  Make sure you Understand these instructions. Will watch your condition. Will get help right away if you are not doing well or get worse.  Thank you for choosing an e-visit.  Your e-visit answers were reviewed by a board certified advanced clinical practitioner to complete your personal care plan. Depending upon the condition, your plan could have included both over the counter or prescription medications.  Please review your pharmacy choice. Make sure the pharmacy is open so you can pick up prescription now. If there is a problem, you may contact your provider through [employer] and have the prescription routed to another pharmacy.  Your safety is important to [place]. If you have drug allergies check your prescription carefully.   For the next 24 hours you can use MyChart to ask questions about today's visit, request a non-urgent call back, or ask for a work or school excuse. You will get an email in the next two days asking about your experience. I hope that your e-visit has been valuable and will speed your recovery.   I have spent 5 minutes in review of e-visit questionnaire, review and updating patient chart, medical decision making and response to patient.   Tylene Fantasia Ward, PA-C

## 2023-01-03 DIAGNOSIS — M9902 Segmental and somatic dysfunction of thoracic region: Secondary | ICD-10-CM | POA: Diagnosis not present

## 2023-01-03 DIAGNOSIS — M542 Cervicalgia: Secondary | ICD-10-CM | POA: Diagnosis not present

## 2023-01-03 DIAGNOSIS — M9901 Segmental and somatic dysfunction of cervical region: Secondary | ICD-10-CM | POA: Diagnosis not present

## 2023-01-03 DIAGNOSIS — M546 Pain in thoracic spine: Secondary | ICD-10-CM | POA: Diagnosis not present

## 2023-02-13 ENCOUNTER — Emergency Department: Payer: BC Managed Care – PPO

## 2023-02-13 ENCOUNTER — Telehealth: Payer: Self-pay | Admitting: Emergency Medicine

## 2023-02-13 ENCOUNTER — Encounter: Payer: Self-pay | Admitting: Emergency Medicine

## 2023-02-13 ENCOUNTER — Emergency Department
Admission: EM | Admit: 2023-02-13 | Discharge: 2023-02-13 | Disposition: A | Discharge status: Home / Self Care | Payer: BC Managed Care – PPO | Attending: Emergency Medicine | Admitting: Emergency Medicine

## 2023-02-13 ENCOUNTER — Other Ambulatory Visit: Payer: Self-pay

## 2023-02-13 DIAGNOSIS — N201 Calculus of ureter: Secondary | ICD-10-CM | POA: Insufficient documentation

## 2023-02-13 DIAGNOSIS — R109 Unspecified abdominal pain: Secondary | ICD-10-CM | POA: Diagnosis not present

## 2023-02-13 DIAGNOSIS — R1030 Lower abdominal pain, unspecified: Secondary | ICD-10-CM | POA: Diagnosis not present

## 2023-02-13 DIAGNOSIS — K5730 Diverticulosis of large intestine without perforation or abscess without bleeding: Secondary | ICD-10-CM | POA: Diagnosis not present

## 2023-02-13 LAB — URINALYSIS, ROUTINE W REFLEX MICROSCOPIC
Bacteria, UA: NONE SEEN
Bilirubin Urine: NEGATIVE
Glucose, UA: NEGATIVE mg/dL
Ketones, ur: NEGATIVE mg/dL
Leukocytes,Ua: NEGATIVE
Nitrite: NEGATIVE
Protein, ur: 100 mg/dL — AB
RBC / HPF: >50 RBC/hpf (ref 0–5)
Specific Gravity, Urine: 1.021 (ref 1.005–1.030)
WBC, UA: >50 WBC/hpf (ref 0–5)
pH: 5 (ref 5.0–8.0)

## 2023-02-13 LAB — CBC WITH DIFFERENTIAL/PLATELET
Abs Immature Granulocytes: 0.01 10*3/uL (ref 0.00–0.07)
Basophils Absolute: 0.1 10*3/uL (ref 0.0–0.1)
Basophils Relative: 1 %
Eosinophils Absolute: 0.1 10*3/uL (ref 0.0–0.5)
Eosinophils Relative: 2 %
HCT: 45.6 % (ref 39.0–52.0)
Hemoglobin: 16.2 g/dL (ref 13.0–17.0)
Immature Granulocytes: 0 %
Lymphocytes Relative: 18 %
Lymphs Abs: 1.3 10*3/uL (ref 0.7–4.0)
MCH: 31 pg (ref 26.0–34.0)
MCHC: 35.5 g/dL (ref 30.0–36.0)
MCV: 87.2 fL (ref 80.0–100.0)
Monocytes Absolute: 0.4 10*3/uL (ref 0.1–1.0)
Monocytes Relative: 6 %
Neutro Abs: 5.3 10*3/uL (ref 1.7–7.7)
Neutrophils Relative %: 73 %
Platelets: 196 10*3/uL (ref 150–400)
RBC: 5.23 MIL/uL (ref 4.22–5.81)
RDW: 11.5 % (ref 11.5–15.5)
WBC: 7.1 10*3/uL (ref 4.0–10.5)
nRBC: 0 % (ref 0.0–0.2)

## 2023-02-13 LAB — COMPREHENSIVE METABOLIC PANEL
ALT: 29 U/L (ref 0–44)
AST: 27 U/L (ref 15–41)
Albumin: 4.4 g/dL (ref 3.5–5.0)
Alkaline Phosphatase: 40 U/L (ref 38–126)
Anion gap: 10 (ref 5–15)
BUN: 17 mg/dL (ref 6–20)
CO2: 25 mmol/L (ref 22–32)
Calcium: 9.2 mg/dL (ref 8.9–10.3)
Chloride: 104 mmol/L (ref 98–111)
Creatinine, Ser: 0.9 mg/dL (ref 0.61–1.24)
GFR, Estimated: >60 mL/min (ref >60)
Glucose, Bld: 121 mg/dL — ABNORMAL HIGH (ref 70–99)
Potassium: 3.8 mmol/L (ref 3.5–5.1)
Sodium: 139 mmol/L (ref 135–145)
Total Bilirubin: 0.9 mg/dL (ref 0.3–1.2)
Total Protein: 7.4 g/dL (ref 6.5–8.1)

## 2023-02-13 MED ORDER — OXYCODONE-ACETAMINOPHEN 5-325 MG PO TABS
1.0000 | ORAL_TABLET | Freq: Once | ORAL | Status: AC
  Administered 2023-02-13: 1 via ORAL
  Filled 2023-02-13: qty 1

## 2023-02-13 MED ORDER — NAPROXEN 500 MG PO TABS: 500.0000 mg | ORAL_TABLET | Freq: Two times a day (BID) | ORAL | 2 refills | Status: DC

## 2023-02-13 MED ORDER — TAMSULOSIN HCL 0.4 MG PO CAPS: 0.4000 mg | ORAL_CAPSULE | Freq: Every day | ORAL | 0 refills | Status: DC

## 2023-02-13 MED ORDER — ONDANSETRON 4 MG PO TBDP: 4.0000 mg | ORAL_TABLET | Freq: Three times a day (TID) | ORAL | 0 refills | Status: DC | PRN

## 2023-02-13 MED ORDER — OXYCODONE-ACETAMINOPHEN 5-325 MG PO TABS: 1.0000 | ORAL_TABLET | Freq: Three times a day (TID) | ORAL | 0 refills | Status: DC | PRN

## 2023-02-13 MED ORDER — KETOROLAC TROMETHAMINE 30 MG/ML IJ SOLN
30.0000 mg | Freq: Once | INTRAMUSCULAR | Status: AC
  Administered 2023-02-13: 30 mg via INTRAVENOUS
  Filled 2023-02-13: qty 1

## 2023-02-13 NOTE — ED Provider Notes (Signed)
[facility] Provider Note    Event Date/Time   First MD Initiated Contact with Patient 02/13/23 [phone]     (approximate)   History   Flank Pain (L)   HPI  Martin Lewis is a 52 y.o. male who presents with complaints of left-sided flank pain which started about 3 AM this morning.  He reports the pain radiates towards his groin.  No history of kidney stones.  No dysuria.  No fevers or chills.  Positive nausea.     Physical Exam   Triage Vital Signs: ED Triage Vitals  Encounter Vitals Group     BP 02/13/23 0553 (!) 134/93     Systolic BP Percentile --      Diastolic BP Percentile --      Pulse Rate 02/13/23 0553 73     Resp 02/13/23 0553 16     Temp 02/13/23 0553 98.1 F (36.7 C)     Temp Source 02/13/23 0553 Oral     SpO2 02/13/23 0553 96 %     Weight 02/13/23 0550 90.7 kg (200 lb)     Height 02/13/23 0550 1.702 m (5\' 7" )     Head Circumference --      Peak Flow --      Pain Score 02/13/23 0549 5     Pain Loc --      Pain Education --      Exclude from Growth Chart --     Most recent vital signs: Vitals:   02/13/23 0553  BP: (!) 134/93  Pulse: 73  Resp: 16  Temp: 98.1 F (36.7 C)  SpO2: 96%     General: Awake, no distress.  CV:  Good peripheral perfusion.  Resp:  Normal effort.  Abd:  No distention.  No CVA tenderness Other:     ED Results / Procedures / Treatments   Labs (all labs ordered are listed, but only abnormal results are displayed) Labs Reviewed  URINALYSIS, ROUTINE W REFLEX MICROSCOPIC - Abnormal; Notable for the following components:      Result Value   Color, Urine BROWN (*)    APPearance CLOUDY (*)    Hgb urine dipstick LARGE (*)    Protein, ur 100 (*)    All other components within normal limits  COMPREHENSIVE METABOLIC PANEL - Abnormal; Notable for the following components:   Glucose, Bld 121 (*)    All other components within normal limits  CBC WITH DIFFERENTIAL/PLATELET      EKG     RADIOLOGY CT scan viewed interpret by me, right-sided ureterolithiasis    PROCEDURES:  Critical Care performed:   Procedures   MEDICATIONS ORDERED IN ED: Medications  ketorolac (TORADOL) 30 MG/ML injection 30 mg (30 mg Intravenous Given 02/13/23 0738)  oxyCODONE-acetaminophen (PERCOCET/ROXICET) 5-325 MG per tablet 1 tablet (1 tablet Oral Given 02/13/23 0738)     IMPRESSION / MDM / ASSESSMENT AND PLAN / ED COURSE  I reviewed the triage vital signs and the nursing notes. Patient's presentation is most consistent with acute presentation with potential threat to life or bodily function.  Patient presents with abdominal pain as detailed above, given primarily flank pain suspicious for ureterolithiasis, differential includes UTI/pyelonephritis, appendicitis  Will send for CT, treat with IV Toradol, obtain labs urine  Analysis without evidence of infection, positive hemoglobin  Lab work overall reassuring, CT scan consistent with 3 x 3 mm stone  Patient's pain is improved, appropriate for discharge with outpatient follow-up, analgesics provided, return precautions  discussed.        FINAL CLINICAL IMPRESSION(S) / ED DIAGNOSES   Final diagnoses:  Ureterolithiasis     Rx / DC Orders   ED Discharge Orders          Ordered    oxyCODONE-acetaminophen (PERCOCET) 5-325 MG tablet  Every 8 hours PRN        02/13/23 0733    naproxen (NAPROSYN) 500 MG tablet  2 times daily with meals        02/13/23 0733    ondansetron (ZOFRAN-ODT) 4 MG disintegrating tablet  Every 8 hours PRN        02/13/23 0733    tamsulosin (FLOMAX) 0.4 MG CAPS capsule  Daily        02/13/23 0733             Note:  This document was prepared using Dragon voice recognition software and may include unintentional dictation errors.   [name], MD 02/13/23 1050

## 2023-02-13 NOTE — Telephone Encounter (Signed)
-----------------------------------------   5:26 PM on 02/13/23 ----------------------------------------- Patient contacted the ED stating that he did not receive prescription for Flomax.  New prescription was sent to his pharmacy.

## 2023-02-13 NOTE — ED Notes (Signed)
Pt sts he is unable to provide urine at this time. Urine specimen cup provided for pt.

## 2023-02-13 NOTE — ED Notes (Signed)
Transported to CT 

## 2023-02-13 NOTE — ED Triage Notes (Signed)
Pt c/o left sided flank pain that started yesterday around 3 pm. Pain radiates into left groin area and testicle. Endorse noticing pain with urination. No urinary bleeding. No groin/testicle swelling.

## 2023-02-20 ENCOUNTER — Telehealth: Payer: BC Managed Care – PPO | Admitting: Physician Assistant

## 2023-02-20 DIAGNOSIS — N201 Calculus of ureter: Secondary | ICD-10-CM

## 2023-02-20 MED ORDER — TAMSULOSIN HCL 0.4 MG PO CAPS: 0.4000 mg | ORAL_CAPSULE | Freq: Every day | ORAL | 0 refills | Status: AC

## 2023-02-20 NOTE — Progress Notes (Signed)
Has already had evaluation in ER with upcoming Urology appt. Only 7 tablets of FLomax were given. Will give one-time refill but patient to contact PCP/Urologist tomorrow AM for follow-up.

## 2023-02-20 NOTE — Progress Notes (Signed)
I have spent 5 minutes in review of e-visit questionnaire, review and updating patient chart, medical decision making and response to patient.   William Cody Martin, PA-C    

## 2023-03-07 ENCOUNTER — Ambulatory Visit
Admission: RE | Admit: 2023-03-07 | Discharge: 2023-03-07 | Disposition: A | Discharge status: Home / Self Care | Payer: BC Managed Care – PPO | Source: Ambulatory Visit | Attending: Urology | Admitting: Urology

## 2023-03-07 ENCOUNTER — Encounter: Payer: Self-pay | Admitting: Urology

## 2023-03-07 ENCOUNTER — Ambulatory Visit: Payer: BC Managed Care – PPO | Admitting: Urology

## 2023-03-07 VITALS — BP 145/93 | HR 74 | Ht 67.0 in | Wt 198.0 lb

## 2023-03-07 DIAGNOSIS — Z09 Encounter for follow-up examination after completed treatment for conditions other than malignant neoplasm: Secondary | ICD-10-CM

## 2023-03-07 DIAGNOSIS — N201 Calculus of ureter: Secondary | ICD-10-CM | POA: Diagnosis not present

## 2023-03-07 DIAGNOSIS — Z87442 Personal history of urinary calculi: Secondary | ICD-10-CM

## 2023-03-07 DIAGNOSIS — N200 Calculus of kidney: Secondary | ICD-10-CM | POA: Diagnosis not present

## 2023-03-07 DIAGNOSIS — N23 Unspecified renal colic: Secondary | ICD-10-CM

## 2023-03-07 LAB — URINALYSIS, COMPLETE
Bilirubin, UA: NEGATIVE
Glucose, UA: NEGATIVE
Ketones, UA: NEGATIVE
Leukocytes,UA: NEGATIVE
Nitrite, UA: NEGATIVE
Protein,UA: NEGATIVE
RBC, UA: NEGATIVE
Specific Gravity, UA: 1.025 (ref 1.005–1.030)
Urobilinogen, Ur: 0.2 mg/dL (ref 0.2–1.0)
pH, UA: 5.5 (ref 5.0–7.5)

## 2023-03-07 LAB — MICROSCOPIC EXAMINATION: Bacteria, UA: NONE SEEN

## 2023-03-07 NOTE — Progress Notes (Signed)
I, Martin Lewis, acting as a scribe for Martin Altes, MD., have documented all relevant documentation on the behalf of Martin Altes, MD, as directed by Martin Altes, MD while in the presence of Martin Altes, MD.  03/07/2023 10:08 AM   Martin Lewis 1970-11-08 [id number]  Referring provider: [name], MD [address] [place],  [place] [address]  Chief Complaint  Patient presents with   Nephrolithiasis    HPI: Martin Lewis is a 52 y.o. male presents for follow-up of recent ED visit for renal colic.   Presented to [facility] ED 02/13/23 with acute onset of a left flank pain radiating to the left groin region.  He had nausea without vomiting. Denied fever or chills. A evaluation in the ED remarkable for a urinalysis, which showed >50 WBC/RBC. A urine culture was not ordered, though he had no signs/symptoms of infection.  CT renal stone studies showed a 3 x 4 mm left UVJ calculus with mild left hydronephrosis.  Since discharge, he has had no recurrent pain, but did have an episode of mild dysuria approximately 2 weeks ago. He is not aware of passing a stone.   PMH: Past Medical History:  Diagnosis Date   Elevated blood pressure reading in office with diagnosis of hypertension 12/05/2021   Humerus fracture    right   Seasonal allergies     Surgical History: Past Surgical History:  Procedure Laterality Date   COLONOSCOPY WITH PROPOFOL N/A 03/10/2022   Procedure: COLONOSCOPY WITH PROPOFOL;  Surgeon: Toney Reil, MD;  Location: [facility] ENDOSCOPY;  Service: Gastroenterology;  Laterality: N/A;   RHINOPLASTY  07/31/1992   [place], [place]   TONSILECTOMY, ADENOIDECTOMY, BILATERAL MYRINGOTOMY AND TUBES  08/01/1979   TONSILLECTOMY     VASECTOMY  07/31/2002    Home Medications:  Allergies as of 03/07/2023   No Known Allergies      Medication List        Accurate as of March 07, 2023 10:08 AM. If you have any questions, ask your nurse or  doctor.          albuterol 108 (90 Base) MCG/ACT inhaler Commonly known as: VENTOLIN HFA Inhale 2 puffs into the lungs Lewis 6 (six) hours as needed for wheezing or shortness of breath.   benzonatate 100 MG capsule Commonly known as: TESSALON Take 1 capsule (100 mg total) by mouth 3 (three) times daily as needed.   dextromethorphan-guaiFENesin 30-600 MG 12hr tablet Commonly known as: MUCINEX DM Take 1 tablet by mouth 2 (two) times daily as needed for cough.   fluticasone 50 MCG/ACT nasal spray Commonly known as: FLONASE Place 2 sprays into both nostrils daily.   multivitamin tablet Take 1 tablet by mouth daily.   naproxen 500 MG tablet Commonly known as: Naprosyn Take 1 tablet (500 mg total) by mouth 2 (two) times daily with a meal.   NON FORMULARY Living Fuel shake, drink one daily with a scoop of protein   ondansetron 4 MG disintegrating tablet Commonly known as: ZOFRAN-ODT Take 1 tablet (4 mg total) by mouth Lewis 8 (eight) hours as needed for nausea or vomiting.   oxyCODONE-acetaminophen 5-325 MG tablet Commonly known as: Percocet Take 1 tablet by mouth Lewis 8 (eight) hours as needed for severe pain.   predniSONE 10 MG (21) Tbpk tablet Commonly known as: STERAPRED UNI-PAK 21 TAB Take by mouth daily. Take 6 tabs by mouth daily  for 1 days, then 5 tabs for  1 days, then 4 tabs for 1 days, then 3 tabs for 1 days, 2 tabs for 1 days, then 1 tab by mouth daily for 1 days        Allergies: No Known Allergies  Family History: Family History  Problem Relation Age of Onset   Hypertension Father    Obesity Father    Hypertension Brother    Obesity Brother    Heart disease Paternal Grandfather    Diabetes Paternal Grandfather     Social History:  reports that he has never smoked. He has never used smokeless tobacco. He reports current alcohol use of about 2.0 standard drinks of alcohol per week. He reports that he does not use drugs.   Physical Exam: BP (!)  145/93   Pulse 74   Ht 5\' 7"  (1.702 m)   Wt 198 lb (89.8 kg)   BMI 31.01 kg/m   Constitutional:  Alert and oriented, No acute distress. HEENT: [place] AT, moist mucus membranes.  Trachea midline, no masses. Cardiovascular: No clubbing, cyanosis, or edema. Respiratory: Normal respiratory effort, no increased work of breathing. GI: Abdomen is soft, nontender, nondistended, no abdominal masses Skin: No rashes, bruises or suspicious lesions. Neurologic: Grossly intact, no focal deficits, moving all 4 extremities. Psychiatric: Normal mood and affect.  Urinalysis Dipstick/microscopy negative   Pertinent Imaging: CT was personally reviewed and interpreted.   CT Renal Stone Study  Narrative CLINICAL DATA:  Left-sided flank pain. Pain radiates to the groin area.  EXAM: CT ABDOMEN AND PELVIS WITHOUT CONTRAST  TECHNIQUE: Multidetector CT imaging of the abdomen and pelvis was performed following the standard protocol without IV contrast.  RADIATION DOSE REDUCTION: This exam was performed according to the departmental dose-optimization program which includes automated exposure control, adjustment of the mA and/or kV according to patient size and/or use of iterative reconstruction technique.  COMPARISON:  None Available.  FINDINGS: Lower chest: Unremarkable.  Hepatobiliary: The liver shows diffusely decreased attenuation suggesting fat deposition. No suspicious focal abnormality in the liver on this study without intravenous contrast. There is no evidence for gallstones, gallbladder wall thickening, or pericholecystic fluid. No intrahepatic or extrahepatic biliary dilation.  Pancreas: No focal mass lesion. No dilatation of the main duct. No intraparenchymal cyst. No peripancreatic edema.  Spleen: No splenomegaly. No suspicious focal mass lesion.  Adrenals/Urinary Tract: No adrenal nodule or mass. Subtle 16 mm water density lesion lower pole right kidney compatible with a  cyst. No followup imaging is recommended. Right kidney and ureter otherwise unremarkable. No stones are seen in the left kidney although there is subtle fullness of the left intrarenal collecting system and left ureter. 3 x 3 x 4 mm stone is identified at the left UVJ (axial 81/2). Bladder is nondistended.  Stomach/Bowel: Stomach is unremarkable. No gastric wall thickening. No evidence of outlet obstruction. Duodenum is normally positioned as is the ligament of Treitz. No small bowel wall thickening. No small bowel dilatation. The terminal ileum is normal. The appendix is normal. No gross colonic mass. No colonic wall thickening. Diverticular changes are noted in the left colon without evidence of diverticulitis.  Vascular/Lymphatic: There is mild atherosclerotic calcification of the abdominal aorta without aneurysm. There is no gastrohepatic or hepatoduodenal ligament lymphadenopathy. No retroperitoneal or mesenteric lymphadenopathy. No pelvic sidewall lymphadenopathy.  Reproductive: The prostate gland and seminal vesicles are unremarkable.  Other: No intraperitoneal free fluid.  Musculoskeletal: No worrisome lytic or sclerotic osseous abnormality.  IMPRESSION: 1. 3 x 3 x 4 mm left UVJ stone with  subtle fullness of the left intrarenal collecting system and left ureter. No other urinary stone disease evident. 2. Hepatic steatosis. 3.  Aortic Atherosclerosis (ICD10-I70.0).   Electronically Signed By: Kennith Center M.D. On: 02/13/2023 07:20   Assessment & Plan:    1. Left distal ureteral calculus  Asymptomatic for 2 weeks and based on stone location he is most likely past the stone KUB ordered today. If stone not visualize, recommend follow-up CT pelvis since his hydronephrosis was not that significant to document resolution.  He is a first time stone former and discussed he has a 50% chance of forming another stone within the next 5 years.  We discussed general stone  prevention guidelines and he was provided literature.  He will be notified with his KUB results and further recommendations.  [facility] [address], [address] [place], [place] [address] [phone]

## 2023-03-08 ENCOUNTER — Encounter: Payer: Self-pay | Admitting: Urology

## 2023-03-08 ENCOUNTER — Telehealth: Payer: Self-pay | Admitting: Urology

## 2023-03-09 ENCOUNTER — Other Ambulatory Visit: Payer: Self-pay | Admitting: *Deleted

## 2023-03-09 DIAGNOSIS — N201 Calculus of ureter: Secondary | ICD-10-CM

## 2023-03-11 NOTE — Telephone Encounter (Signed)
error 

## 2023-03-14 ENCOUNTER — Other Ambulatory Visit: Payer: Self-pay | Admitting: *Deleted

## 2023-03-15 ENCOUNTER — Encounter (INDEPENDENT_AMBULATORY_CARE_PROVIDER_SITE_OTHER): Payer: Self-pay

## 2023-04-25 ENCOUNTER — Encounter: Payer: Self-pay | Admitting: Nurse Practitioner

## 2023-04-26 ENCOUNTER — Encounter: Payer: Self-pay | Admitting: Nurse Practitioner

## 2023-07-05 ENCOUNTER — Encounter: Payer: Self-pay | Admitting: Nurse Practitioner

## 2023-07-05 ENCOUNTER — Ambulatory Visit: Payer: Self-pay | Admitting: Nurse Practitioner

## 2023-07-05 VITALS — BP 138/98 | HR 72 | Temp 98.3°F | Ht 67.0 in | Wt 215.4 lb

## 2023-07-05 DIAGNOSIS — R1319 Other dysphagia: Secondary | ICD-10-CM | POA: Diagnosis not present

## 2023-07-05 DIAGNOSIS — I10 Essential (primary) hypertension: Secondary | ICD-10-CM | POA: Diagnosis not present

## 2023-07-05 DIAGNOSIS — E669 Obesity, unspecified: Secondary | ICD-10-CM

## 2023-07-05 MED ORDER — OMEPRAZOLE 20 MG PO CPDR: 20.0000 mg | DELAYED_RELEASE_CAPSULE | Freq: Every day | ORAL | 0 refills | Status: DC

## 2023-07-05 NOTE — Patient Instructions (Signed)
Nice to see you today I have sent in some medication to the pharmacy  Follow up with me as scheduled

## 2023-07-05 NOTE — Assessment & Plan Note (Signed)
Will trial patient on omeprazole 20 mg daily to see if this is effective.  Patient states he does have relatives that required esophageal stretching.  Ambulatory referral to GI today.  Signs and symptoms reviewed when to seek emergent health care

## 2023-07-05 NOTE — Progress Notes (Signed)
Acute Office Visit  Subjective:     Patient ID: Martin Lewis, male    DOB: 05-Nov-1970, 52 y.o.   MRN: [id number]  Chief Complaint  Patient presents with   trouble swallowing    Pt complains of not being able to swallow food completely. States that food is lodged and can feel it in shoulder blade. States that it is painful and usually last for 30 minutes and then the food will pass through.  Symptoms began beginning of the year.     HPI Patient is in today for difficulty swallowing with a history that is noncontributory  Symtpoms that it started at the beginning of the year and has been intermitten. He does not have trouble chewing or swallowing. State that it will get stuck and he can feel it in his back. Happened on thanksgiving and then Sunday. States Sunday it stayed there longer this past time. States that it is likely meat. It has been sandwich, [place], and steak. States that he has tried to put something behind it. States that it was uncomfortable after then vomitied but that did not help. States that he did try soda and it eventually cleared.  States he can point to the spot on the back where he feels like the food gets stuck  Review of Systems  Constitutional:  Negative for chills and fever.  Respiratory:  Negative for shortness of breath.   Cardiovascular:  Negative for chest pain.  Gastrointestinal:  Negative for abdominal pain, heartburn, nausea and vomiting.  Musculoskeletal:  Positive for back pain.        Objective:    BP (!) 138/98   Pulse 72   Temp 98.3 F (36.8 C) (Oral)   Ht 5\' 7"  (1.702 m)   Wt 215 lb 6.4 oz (97.7 kg)   SpO2 97%   BMI 33.74 kg/m  BP Readings from Last 3 Encounters:  07/05/23 (!) 138/98  03/07/23 (!) 145/93  02/13/23 (!) 134/93   Wt Readings from Last 3 Encounters:  07/05/23 215 lb 6.4 oz (97.7 kg)  03/07/23 198 lb (89.8 kg)  02/13/23 200 lb (90.7 kg)   SpO2 Readings from Last 3 Encounters:  07/05/23 97%  02/13/23 96%   03/10/22 96%      Physical Exam Vitals and nursing note reviewed.  Constitutional:      Appearance: Normal appearance.  Cardiovascular:     Rate and Rhythm: Normal rate and regular rhythm.     Heart sounds: Normal heart sounds.  Pulmonary:     Effort: Pulmonary effort is normal.     Breath sounds: Normal breath sounds.  Abdominal:     General: Bowel sounds are normal. There is no distension.     Palpations: There is no mass.     Tenderness: There is no abdominal tenderness.     Hernia: No hernia is present.  Neurological:     Mental Status: He is alert.     No results found for any visits on 07/05/23.      Assessment & Plan:   Problem List Items Addressed This Visit       Cardiovascular and Mediastinum   Elevated blood pressure reading in office with diagnosis of hypertension    Patient currently not on any medication.  He has gained some weight and he knows he needs to lose weight per his report if he is not made any progress with blood pressure lifestyle modifications at physical we will put patient on blood pressure  medication        Digestive   Esophageal dysphagia - Primary    Will trial patient on omeprazole 20 mg daily to see if this is effective.  Patient states he does have relatives that required esophageal stretching.  Ambulatory referral to GI today.  Signs and symptoms reviewed when to seek emergent health care      Relevant Medications   omeprazole (PRILOSEC) 20 MG capsule   Other Relevant Orders   Ambulatory referral to Gastroenterology     Other   Obesity (BMI 30-39.9)    Continue working healthy lifestyle modifications.       Meds ordered this encounter  Medications   omeprazole (PRILOSEC) 20 MG capsule    Sig: Take 1 capsule (20 mg total) by mouth daily.    Dispense:  90 capsule    Refill:  0    Order Specific Question:   Supervising Provider    Answer:   Roxy Manns A [[id number]]    Return if symptoms worsen or fail to improve, for As  scheduled .  Audria Nine, NP

## 2023-07-05 NOTE — Assessment & Plan Note (Signed)
Continue working healthy lifestyle modifications.

## 2023-07-05 NOTE — Assessment & Plan Note (Signed)
Patient currently not on any medication.  He has gained some weight and he knows he needs to lose weight per his report if he is not made any progress with blood pressure lifestyle modifications at physical we will put patient on blood pressure medication

## 2023-07-11 ENCOUNTER — Encounter: Payer: Self-pay | Admitting: Nurse Practitioner

## 2023-07-11 DIAGNOSIS — R1319 Other dysphagia: Secondary | ICD-10-CM

## 2023-07-12 MED ORDER — OMEPRAZOLE 20 MG PO CPDR
20.0000 mg | DELAYED_RELEASE_CAPSULE | Freq: Every day | ORAL | 1 refills | Status: AC
Start: 2023-07-12 — End: ?

## 2023-08-18 ENCOUNTER — Telehealth: Payer: Self-pay | Admitting: Urology

## 2023-08-18 NOTE — Telephone Encounter (Signed)
 error

## 2023-09-03 ENCOUNTER — Ambulatory Visit (INDEPENDENT_AMBULATORY_CARE_PROVIDER_SITE_OTHER): Payer: Managed Care, Other (non HMO) | Admitting: Nurse Practitioner

## 2023-09-03 ENCOUNTER — Telehealth: Payer: Self-pay | Admitting: Nurse Practitioner

## 2023-09-03 ENCOUNTER — Encounter: Payer: Self-pay | Admitting: Nurse Practitioner

## 2023-09-03 VITALS — BP 138/96 | HR 70 | Temp 97.8°F | Ht 66.0 in | Wt 219.2 lb

## 2023-09-03 DIAGNOSIS — Z23 Encounter for immunization: Secondary | ICD-10-CM | POA: Diagnosis not present

## 2023-09-03 DIAGNOSIS — Z0000 Encounter for general adult medical examination without abnormal findings: Secondary | ICD-10-CM

## 2023-09-03 DIAGNOSIS — E669 Obesity, unspecified: Secondary | ICD-10-CM

## 2023-09-03 DIAGNOSIS — R229 Localized swelling, mass and lump, unspecified: Secondary | ICD-10-CM | POA: Diagnosis not present

## 2023-09-03 DIAGNOSIS — Z13220 Encounter for screening for lipoid disorders: Secondary | ICD-10-CM

## 2023-09-03 DIAGNOSIS — Z131 Encounter for screening for diabetes mellitus: Secondary | ICD-10-CM

## 2023-09-03 DIAGNOSIS — R1319 Other dysphagia: Secondary | ICD-10-CM

## 2023-09-03 DIAGNOSIS — Z125 Encounter for screening for malignant neoplasm of prostate: Secondary | ICD-10-CM

## 2023-09-03 DIAGNOSIS — I10 Essential (primary) hypertension: Secondary | ICD-10-CM

## 2023-09-03 MED ORDER — AMLODIPINE BESYLATE 5 MG PO TABS: 5.0000 mg | ORAL_TABLET | Freq: Every day | ORAL | 0 refills | Status: DC

## 2023-09-03 NOTE — Assessment & Plan Note (Signed)
Start patient on amlodipine 5 mg daily.  Continue working healthy lifestyle modifications

## 2023-09-03 NOTE — Assessment & Plan Note (Signed)
Has improved since omeprazole 20 mg.  Patient has not gotten in touch with referral coordinator to schedule an appointment.

## 2023-09-03 NOTE — Assessment & Plan Note (Signed)
Benign appearing skin nodule to the right inferior anterior scrotum

## 2023-09-03 NOTE — Assessment & Plan Note (Signed)
Discussed age-appropriate immunizations and screening exams.  Did review patient's personal, surgical, social, family histories.  Patient is up-to-date on all age-appropriate vaccinations he would like.  Update flu vaccine and first shingles vaccine in office today.  Patient up-to-date on CRC screening.  PSA for prostate cancer screening today.  Patient is given information at discharge about preventative healthcare maintenance with anticipatory guidance

## 2023-09-03 NOTE — Assessment & Plan Note (Signed)
Pending TSH, A1c, lipid panel today.  Patient to continue work on healthy lifestyle applications.  Information given to patient at discharge about healthy weight wellness clinic.

## 2023-09-03 NOTE — Patient Instructions (Signed)
Nice to see you today Make a fasting lab appointment over the next 2 weeks Follow up with me in 6 weeks for BP recheck   3 months nurse visit for the second and final shingles vaccine.

## 2023-09-03 NOTE — Telephone Encounter (Signed)
I saw Martin Lewis today in office.  He states he never got the messages from the referral for the esophageal motility issue.  Can we reach back out to the patient I did verify that the start number is the primary number.  If you need a new referral please let me know  Thanks, Matt C

## 2023-09-03 NOTE — Progress Notes (Signed)
Established Patient Office Visit  Subjective   Patient ID: Martin Lewis, male    DOB: [birth year]  Age: 53 y.o. MRN: [id number]  Chief Complaint  Patient presents with   Annual Exam    HPI  for complete physical and follow up of chronic conditions.  GERD: currenelty on the omeprazole. States that he is cutting up tfood samller and chewing longer. 1 Eposide since starting omeprazole  Allergies: everyday with xzyal and flonase   Immunizations: -Tetanus: Completed in 2023 -Influenza: update today  -Shingles: First vaccine today -Pneumonia: too young Covid: original series   Diet: Fair diet. He is eating a protien shake and then a mid morning snack, then lunch, cup of fruit in the afternoon then will eat dinner, and night time snacking. Coffee, 1 diet soda a day, water, sugar free lemonade Exercise:  Walks a lot with work. He will walk and hike at home. HIIT 2 days a week   Eye exam: Completes annually. Reading glasses   Dental exam: Needs updating    Colonoscopy: Completed in 03/10/2022, repeat in 5 years Lung Cancer Screening: N/A  PSA: Due  Sleep: he is going to bed around 10 and will get up around 6-630. Feels rested most day. Does snore        Review of Systems  Constitutional:  Negative for chills and fever.  Eyes:  Negative for blurred vision.  Respiratory:  Negative for shortness of breath.   Cardiovascular:  Negative for chest pain and leg swelling.  Gastrointestinal:  Negative for abdominal pain, blood in stool, constipation, diarrhea, nausea and vomiting.       Bm daily   Genitourinary:  Negative for dysuria and hematuria.  Neurological:  Negative for dizziness, tingling and headaches.  Psychiatric/Behavioral:  Negative for hallucinations and suicidal ideas.       Objective:     BP (!) 138/96   Pulse 70   Temp 97.8 F (36.6 C) (Oral)   Ht 5\' 6"  (1.676 m)   Wt 219 lb 3.2 oz (99.4 kg)   SpO2 96%   BMI 35.38 kg/m  BP Readings from Last 3  Encounters:  09/03/23 (!) 138/96  07/05/23 (!) 138/98  03/07/23 (!) 145/93   Wt Readings from Last 3 Encounters:  09/03/23 219 lb 3.2 oz (99.4 kg)  07/05/23 215 lb 6.4 oz (97.7 kg)  03/07/23 198 lb (89.8 kg)   SpO2 Readings from Last 3 Encounters:  09/03/23 96%  07/05/23 97%  02/13/23 96%      Physical Exam Vitals and nursing note reviewed. Exam conducted with a chaperone present Melina Copa, CMA).  Constitutional:      Appearance: Normal appearance.  HENT:     Right Ear: Tympanic membrane, ear canal and external ear normal.     Left Ear: Tympanic membrane, ear canal and external ear normal.     Mouth/Throat:     Mouth: Mucous membranes are moist.     Pharynx: Oropharynx is clear.  Eyes:     Extraocular Movements: Extraocular movements intact.     Pupils: Pupils are equal, round, and reactive to light.  Cardiovascular:     Rate and Rhythm: Normal rate and regular rhythm.     Pulses: Normal pulses.     Heart sounds: Normal heart sounds.  Pulmonary:     Effort: Pulmonary effort is normal.     Breath sounds: Normal breath sounds.  Abdominal:     General: Bowel sounds are normal. There is no distension.  Palpations: There is no mass.     Tenderness: There is no abdominal tenderness.     Hernia: No hernia is present.  Genitourinary:   Musculoskeletal:     Right lower leg: No edema.     Left lower leg: No edema.  Lymphadenopathy:     Cervical: No cervical adenopathy.  Skin:    General: Skin is warm.  Neurological:     General: No focal deficit present.     Mental Status: He is alert.     Deep Tendon Reflexes:     Reflex Scores:      Bicep reflexes are 2+ on the right side and 2+ on the left side.      Patellar reflexes are 2+ on the right side and 2+ on the left side.    Comments: Bilateral upper and lower extremity strength 5/5  Psychiatric:        Mood and Affect: Mood normal.        Behavior: Behavior normal.        Thought Content: Thought content  normal.        Judgment: Judgment normal.      No results found for any visits on 09/03/23.    The 10-year ASCVD risk score (Arnett DK, et al., 2019) is: 7.3%    Assessment & Plan:   Problem List Items Addressed This Visit       Cardiovascular and Mediastinum   Primary hypertension   Start patient on amlodipine 5 mg daily.  Continue working healthy lifestyle modifications      Relevant Medications   amLODipine (NORVASC) 5 MG tablet   Other Relevant Orders   CBC   Comprehensive metabolic panel   TSH     Digestive   Esophageal dysphagia   Has improved since omeprazole 20 mg.  Patient has not gotten in touch with referral coordinator to schedule an appointment.        Musculoskeletal and Integument   Skin nodule   Benign appearing skin nodule to the right inferior anterior scrotum        Other   Obesity (BMI 30-39.9)   Pending TSH, A1c, lipid panel today.  Patient to continue work on healthy lifestyle applications.  Information given to patient at discharge about healthy weight wellness clinic.      Relevant Orders   Hemoglobin A1c   Lipid panel   Preventative health care - Primary   Discussed age-appropriate immunizations and screening exams.  Did review patient's personal, surgical, social, family histories.  Patient is up-to-date on all age-appropriate vaccinations he would like.  Update flu vaccine and first shingles vaccine in office today.  Patient up-to-date on CRC screening.  PSA for prostate cancer screening today.  Patient is given information at discharge about preventative healthcare maintenance with anticipatory guidance      Other Visit Diagnoses       Screening for prostate cancer       Relevant Orders   PSA     Screening for diabetes mellitus       Relevant Orders   Hemoglobin A1c     Screening for lipid disorders       Relevant Orders   Lipid panel     Need for shingles vaccine       Relevant Orders   Zoster Recombinant (Shingrix )  (Completed)     Need for influenza vaccination       Relevant Orders   Flu vaccine trivalent PF, 6mos and older(Flulaval,Afluria,Fluarix,Fluzone) (  Completed)       Return in about 6 weeks (around 10/15/2023) for BP recheck.    Audria Nine, NP

## 2023-09-05 ENCOUNTER — Telehealth: Payer: Self-pay

## 2023-09-05 NOTE — Telephone Encounter (Signed)
 I called the patient to try to schedule him for an appointment. We have one more spot open and the patient didn't answer I left a voicemail to call back.

## 2023-09-06 ENCOUNTER — Other Ambulatory Visit: Payer: Self-pay | Admitting: Nurse Practitioner

## 2023-09-06 DIAGNOSIS — I10 Essential (primary) hypertension: Secondary | ICD-10-CM

## 2023-09-07 MED ORDER — AMLODIPINE BESYLATE 5 MG PO TABS: 5.0000 mg | ORAL_TABLET | Freq: Every day | ORAL | 0 refills | Status: DC

## 2023-09-17 ENCOUNTER — Other Ambulatory Visit (INDEPENDENT_AMBULATORY_CARE_PROVIDER_SITE_OTHER): Payer: Managed Care, Other (non HMO)

## 2023-09-17 DIAGNOSIS — I10 Essential (primary) hypertension: Secondary | ICD-10-CM | POA: Diagnosis not present

## 2023-09-17 DIAGNOSIS — Z13220 Encounter for screening for lipoid disorders: Secondary | ICD-10-CM | POA: Diagnosis not present

## 2023-09-17 DIAGNOSIS — E669 Obesity, unspecified: Secondary | ICD-10-CM

## 2023-09-17 DIAGNOSIS — Z125 Encounter for screening for malignant neoplasm of prostate: Secondary | ICD-10-CM | POA: Diagnosis not present

## 2023-09-17 DIAGNOSIS — Z131 Encounter for screening for diabetes mellitus: Secondary | ICD-10-CM

## 2023-09-17 LAB — LIPID PANEL
Cholesterol: 187 mg/dL (ref 0–200)
HDL: 34.3 mg/dL — ABNORMAL LOW (ref >39.00)
LDL Cholesterol: 130 mg/dL — ABNORMAL HIGH (ref 0–99)
NonHDL: 152.89
Total CHOL/HDL Ratio: 5
Triglycerides: 114 mg/dL (ref 0.0–149.0)
VLDL: 22.8 mg/dL (ref 0.0–40.0)

## 2023-09-17 LAB — CBC
HCT: 43.5 % (ref 39.0–52.0)
Hemoglobin: 15.6 g/dL (ref 13.0–17.0)
MCHC: 35.9 g/dL (ref 30.0–36.0)
MCV: 87.9 fL (ref 78.0–100.0)
Platelets: 220 10*3/uL (ref 150.0–400.0)
RBC: 4.95 Mil/uL (ref 4.22–5.81)
RDW: 12.9 % (ref 11.5–15.5)
WBC: 5.1 10*3/uL (ref 4.0–10.5)

## 2023-09-17 LAB — COMPREHENSIVE METABOLIC PANEL
ALT: 27 U/L (ref 0–53)
AST: 23 U/L (ref 0–37)
Albumin: 4.5 g/dL (ref 3.5–5.2)
Alkaline Phosphatase: 44 U/L (ref 39–117)
BUN: 14 mg/dL (ref 6–23)
CO2: 29 meq/L (ref 19–32)
Calcium: 9 mg/dL (ref 8.4–10.5)
Chloride: 104 meq/L (ref 96–112)
Creatinine, Ser: 0.88 mg/dL (ref 0.40–1.50)
GFR: 99.23 mL/min (ref >60.00)
Glucose, Bld: 105 mg/dL — ABNORMAL HIGH (ref 70–99)
Potassium: 4.2 meq/L (ref 3.5–5.1)
Sodium: 141 meq/L (ref 135–145)
Total Bilirubin: 0.9 mg/dL (ref 0.2–1.2)
Total Protein: 6.7 g/dL (ref 6.0–8.3)

## 2023-09-17 LAB — TSH: TSH: 1.26 u[IU]/mL (ref 0.35–5.50)

## 2023-09-17 LAB — PSA: PSA: 0.23 ng/mL (ref 0.10–4.00)

## 2023-09-17 LAB — HEMOGLOBIN A1C: Hgb A1c MFr Bld: 5 % (ref 4.6–6.5)

## 2023-09-21 ENCOUNTER — Encounter: Payer: Self-pay | Admitting: Nurse Practitioner

## 2023-10-17 ENCOUNTER — Ambulatory Visit: Payer: Managed Care, Other (non HMO) | Admitting: Nurse Practitioner

## 2023-10-17 ENCOUNTER — Encounter: Payer: Self-pay | Admitting: Nurse Practitioner

## 2023-10-17 VITALS — BP 138/88 | HR 70 | Temp 98.0°F | Ht 66.0 in | Wt 218.6 lb

## 2023-10-17 DIAGNOSIS — I10 Essential (primary) hypertension: Secondary | ICD-10-CM | POA: Diagnosis not present

## 2023-10-17 MED ORDER — AMLODIPINE BESYLATE 10 MG PO TABS: 10.0000 mg | ORAL_TABLET | Freq: Every day | ORAL | 1 refills | Status: DC

## 2023-10-17 NOTE — Progress Notes (Signed)
   Established Patient Office Visit  Subjective   Patient ID: Martin Lewis, male    DOB: February 06, 1971  Age: 53 y.o. MRN: [id number]  Chief Complaint  Patient presents with   Hypertension     Martin Lewis is here for a follow up after being newly diagnosed with hypertension last month. He was placed on amlodipine 5 mg daily and educated on lifestyle changes. He has been taking his medication consistently and only reports missing one dose a week ago. He does not check his BP at home, but reports he will start having his wife check it. He has added more raw vegetables to his diet and is practicing more mindful eating. He reports increasing his walking at work and started lifting weights last night and will have a goal of 4 times per week. He denies any dizziness, CP, ShOB, or peripheral edema.   Review of Systems  Constitutional:  Negative for weight loss.  Eyes:  Negative for blurred vision.  Respiratory:  Negative for shortness of breath.   Cardiovascular:  Negative for chest pain and palpitations.  Gastrointestinal:  Negative for abdominal pain, nausea and vomiting.  Neurological:  Negative for dizziness and headaches.      Objective:     BP 138/88   Pulse 70   Temp 98 F (36.7 C) (Oral)   Ht 5\' 6"  (1.676 m)   Wt 99.2 kg   SpO2 95%   BMI 35.28 kg/m  BP Readings from Last 3 Encounters:  10/17/23 138/88  09/03/23 (!) 138/96  07/05/23 (!) 138/98   Wt Readings from Last 3 Encounters:  10/17/23 99.2 kg  09/03/23 99.4 kg  07/05/23 97.7 kg      Physical Exam Constitutional:      Appearance: Normal appearance.  Cardiovascular:     Rate and Rhythm: Normal rate and regular rhythm.     Pulses: Normal pulses.          Radial pulses are 2+ on the right side and 2+ on the left side.       Posterior tibial pulses are 2+ on the right side and 2+ on the left side.     Heart sounds: Normal heart sounds. No murmur heard.    No friction rub. No gallop.  Pulmonary:     Effort:  Pulmonary effort is normal.     Breath sounds: Normal breath sounds.  Musculoskeletal:     Right lower leg: No edema.     Left lower leg: No edema.  Neurological:     Mental Status: He is alert.      No results found for any visits on 10/17/23.    The 10-year ASCVD risk score (Arnett DK, et al., 2019) is: 7.4%    Assessment & Plan:   Problem List Items Addressed This Visit     Primary hypertension - Primary   Increase amlodipine to 10 mg PO daily. Continue working on lifestyle changes and increasing daily exercise.       Relevant Medications   amLODipine (NORVASC) 10 MG tablet    Return in about 3 months (around 01/17/2024) for BP.    Murvin Donning, RN

## 2023-10-17 NOTE — Patient Instructions (Signed)
 It was a pleasure seeing you today.  You can start taking 10 mg of your Amlodipine.  Let us know if you experience any lightheadedness or dizziness on the higher dose.  Follow up in 3 months.

## 2023-10-17 NOTE — Assessment & Plan Note (Addendum)
 Increase amlodipine to 10 mg PO daily. Continue working on lifestyle changes and increasing daily exercise. Encouraged patient to check blood pressure 1-2 times a week at home. He is also considering going to the healthy weight and wellness clinic   I evaluated patient, was consulted regarding treatment, and agree with assessment and plan per Denice Bors RN, FNP Student   Audria Nine, DNP, AGNP-C

## 2023-10-17 NOTE — Progress Notes (Signed)
   Established Patient Office Visit  Subjective   Patient ID: Martin Lewis, male    DOB: 09/25/1970  Age: 53 y.o. MRN: [id number]  Chief Complaint  Patient presents with   Hypertension    HPI  HTN: patinet was seen by me on 09/03/2023. He was started on amlodipine 5mg  at that time he is here for a recheck. He has been working on lifestyle changes and has been doing more mindful eating by incorporating more veggies into his diet. He has increased his physical activity and has been walking more at work and started lifting weight last night with he goal of doing that 4 times a week. He has tolerated the medication well. Has not been checking it at home but will start. States that his wife is a Engineer, civil (consulting).     Review of Systems  Constitutional:  Negative for chills and fever.  Respiratory:  Negative for shortness of breath.   Cardiovascular:  Negative for chest pain and leg swelling.  Neurological:  Negative for dizziness and headaches.      Objective:     BP 138/88   Pulse 70   Temp 98 F (36.7 C) (Oral)   Ht 5\' 6"  (1.676 m)   Wt 218 lb 9.6 oz (99.2 kg)   SpO2 95%   BMI 35.28 kg/m  BP Readings from Last 3 Encounters:  10/17/23 138/88  09/03/23 (!) 138/96  07/05/23 (!) 138/98   Wt Readings from Last 3 Encounters:  10/17/23 218 lb 9.6 oz (99.2 kg)  09/03/23 219 lb 3.2 oz (99.4 kg)  07/05/23 215 lb 6.4 oz (97.7 kg)   SpO2 Readings from Last 3 Encounters:  10/17/23 95%  09/03/23 96%  07/05/23 97%      Physical Exam Vitals and nursing note reviewed.  Constitutional:      Appearance: Normal appearance.  Cardiovascular:     Rate and Rhythm: Normal rate and regular rhythm.     Heart sounds: Normal heart sounds.  Pulmonary:     Effort: Pulmonary effort is normal.     Breath sounds: Normal breath sounds.  Neurological:     Mental Status: He is alert.      No results found for any visits on 10/17/23.    The 10-year ASCVD risk score (Arnett DK, et al., 2019)  is: 7.4%    Assessment & Plan:   Problem List Items Addressed This Visit       Cardiovascular and Mediastinum   Primary hypertension - Primary   Increase amlodipine to 10 mg PO daily. Continue working on lifestyle changes and increasing daily exercise. Encouraged patient to check blood pressure 1-2 times a week at home. He is also considering [facility]   I evaluated patient, was consulted regarding treatment, and agree with assessment and plan per Denice Bors RN, FNP Student   Audria Nine, DNP, AGNP-C        Relevant Medications   amLODipine (NORVASC) 10 MG tablet    Return in about 3 months (around 01/17/2024) for BP.    Audria Nine, NP

## 2023-11-23 ENCOUNTER — Encounter: Payer: Self-pay | Admitting: Nurse Practitioner

## 2023-11-23 DIAGNOSIS — I10 Essential (primary) hypertension: Secondary | ICD-10-CM

## 2023-11-27 MED ORDER — AMLODIPINE BESYLATE 5 MG PO TABS: 5.0000 mg | ORAL_TABLET | Freq: Every day | ORAL | 0 refills | Status: DC

## 2023-11-27 NOTE — Telephone Encounter (Signed)
 2 week follow up in office with me for HTN and edema

## 2023-11-27 NOTE — Telephone Encounter (Signed)
 Lvm sent mychart to schedule follow up in 2weeks

## 2023-11-27 NOTE — Addendum Note (Signed)
 Addended by: Dorothe Gaster on: 11/27/2023 11:02 AM   Modules accepted: Orders

## 2023-11-29 NOTE — Telephone Encounter (Signed)
 Lvm to schedule

## 2023-12-03 ENCOUNTER — Other Ambulatory Visit: Payer: Self-pay | Admitting: Nurse Practitioner

## 2023-12-03 DIAGNOSIS — I10 Essential (primary) hypertension: Secondary | ICD-10-CM

## 2023-12-04 ENCOUNTER — Ambulatory Visit: Payer: Managed Care, Other (non HMO)

## 2023-12-04 DIAGNOSIS — Z23 Encounter for immunization: Secondary | ICD-10-CM | POA: Diagnosis not present

## 2023-12-04 NOTE — Progress Notes (Signed)
 Per orders of Mordecai Maes, NP , injection of shingrix given by Lewanda Rife in right deltoid. Patient tolerated injection well.

## 2023-12-23 ENCOUNTER — Other Ambulatory Visit: Payer: Self-pay | Admitting: Nurse Practitioner

## 2023-12-23 DIAGNOSIS — I10 Essential (primary) hypertension: Secondary | ICD-10-CM

## 2024-01-17 ENCOUNTER — Ambulatory Visit: Admitting: Nurse Practitioner

## 2024-01-25 ENCOUNTER — Other Ambulatory Visit: Payer: Self-pay | Admitting: Nurse Practitioner

## 2024-01-25 DIAGNOSIS — I10 Essential (primary) hypertension: Secondary | ICD-10-CM

## 2024-02-11 ENCOUNTER — Ambulatory Visit: Admitting: Nurse Practitioner

## 2024-02-11 VITALS — BP 128/82 | HR 74 | Temp 98.3°F | Ht 66.0 in | Wt 220.0 lb

## 2024-02-11 DIAGNOSIS — K429 Umbilical hernia without obstruction or gangrene: Secondary | ICD-10-CM | POA: Insufficient documentation

## 2024-02-11 DIAGNOSIS — H6993 Unspecified Eustachian tube disorder, bilateral: Secondary | ICD-10-CM | POA: Diagnosis not present

## 2024-02-11 DIAGNOSIS — I10 Essential (primary) hypertension: Secondary | ICD-10-CM | POA: Diagnosis not present

## 2024-02-11 NOTE — Assessment & Plan Note (Signed)
 Patient currently on second-generation antihistamine and Flonase  as needed.  Patient is in need which antihistamine he is taking via MyChart and use Flonase  daily

## 2024-02-11 NOTE — Assessment & Plan Note (Signed)
 Patient currently maintained amlodipine  5 mg daily.  Does not check blood pressure at home.  Blood pressure well-controlled.  Continue amlodipine  5 mg daily

## 2024-02-11 NOTE — Patient Instructions (Addendum)
 Nice to see you today Continue the amlodipine  5mg  daily as is Follow up with me in approx 7 months for your physical   Let me know what allergy pill you have at home Take the flonase  everyday.   [facility]  Address: [address] [place], [place], [place] [address] Hours:  Closes soon ? 5?PM ? Opens 7?AM Tue Confirmed by this business 7 weeks ago Phone: [phone]

## 2024-02-11 NOTE — Progress Notes (Signed)
 Established Patient Office Visit  Subjective   Patient ID: Martin Lewis, male    DOB: [birth year]  Age: 53 y.o. MRN: [id number]  Chief Complaint  Patient presents with   Follow-up    3 month BP Recheck. Pt is not checking at home but feels good.     HPI  HTN: patient is currently on amlodipine  5mg  daily. He was last seen by me on 10/17/2023 where we increased his amlodipine  to 10 mg daily. He messaged me about having some lower extremity swelling and we decreased the amlodipine  to 5mg  and he is here for a follow up. States that when he reduced the amlodipine  to 5mg  the swelling decreased. States that he will get swelling intermittently that is minimal   Exercise: states that he is doing 4-5 days a week with brisk walk at work that is equal to 5 miles States that he is trying to eat better. He is trying to eat fruit with his lunch. Red meat 1-2 times a week   Ears: states that he does take allergies pills and feels like over the past month he feels like stopped up. State the he does use q tips but feels like he hears fine     Review of Systems  Constitutional:  Negative for chills and fever.  Respiratory:  Negative for shortness of breath.   Cardiovascular:  Negative for chest pain.  Neurological:  Negative for dizziness and headaches.      Objective:     BP 128/82   Pulse 74   Temp 98.3 F (36.8 C) (Oral)   Ht 5' 6 (1.676 m)   Wt 220 lb (99.8 kg)   SpO2 96%   BMI 35.51 kg/m  BP Readings from Last 3 Encounters:  02/11/24 128/82  10/17/23 138/88  09/03/23 (!) 138/96   Wt Readings from Last 3 Encounters:  02/11/24 220 lb (99.8 kg)  10/17/23 218 lb 9.6 oz (99.2 kg)  09/03/23 219 lb 3.2 oz (99.4 kg)   SpO2 Readings from Last 3 Encounters:  02/11/24 96%  10/17/23 95%  09/03/23 96%      Physical Exam Vitals and nursing note reviewed.  Constitutional:      Appearance: Normal appearance.  HENT:     Right Ear: Ear canal and external ear normal.     Left  Ear: Ear canal and external ear normal.  Cardiovascular:     Rate and Rhythm: Normal rate and regular rhythm.     Heart sounds: Normal heart sounds.  Pulmonary:     Effort: Pulmonary effort is normal.     Breath sounds: Normal breath sounds.  Abdominal:     General: Bowel sounds are normal.     Hernia: A hernia (umbilical) is present.  Musculoskeletal:     Right lower leg: No edema.     Left lower leg: No edema.  Lymphadenopathy:     Cervical: No cervical adenopathy.  Neurological:     Mental Status: He is alert.      No results found for any visits on 02/11/24.    The 10-year ASCVD risk score (Arnett DK, et al., 2019) is: 6.5%    Assessment & Plan:   Problem List Items Addressed This Visit       Cardiovascular and Mediastinum   Primary hypertension   Patient currently maintained amlodipine  5 mg daily.  Does not check blood pressure at home.  Blood pressure well-controlled.  Continue amlodipine  5 mg daily  Nervous and Auditory   Dysfunction of both eustachian tubes   Patient currently on second-generation antihistamine and Flonase  as needed.  Patient is in need which antihistamine he is taking via MyChart and use Flonase  daily        Other   Umbilical hernia without obstruction and without gangrene - Primary   Incidental finding.  Reducible in office.  Precautions reviewed information given at discharge about umbilical hernias       Return in about 7 months (around 09/13/2024) for CPE and Labs.    Adina Crandall, NP

## 2024-02-11 NOTE — Assessment & Plan Note (Signed)
 Incidental finding.  Reducible in office.  Precautions reviewed information given at discharge about umbilical hernias

## 2024-02-21 ENCOUNTER — Encounter: Payer: Self-pay | Admitting: Nurse Practitioner

## 2024-02-22 ENCOUNTER — Other Ambulatory Visit: Payer: Self-pay | Admitting: Nurse Practitioner

## 2024-02-22 DIAGNOSIS — I10 Essential (primary) hypertension: Secondary | ICD-10-CM

## 2024-08-20 ENCOUNTER — Other Ambulatory Visit: Payer: Self-pay | Admitting: Nurse Practitioner

## 2024-08-20 DIAGNOSIS — I10 Essential (primary) hypertension: Secondary | ICD-10-CM

## 2024-09-02 ENCOUNTER — Other Ambulatory Visit: Payer: Self-pay | Admitting: Nurse Practitioner

## 2024-09-02 DIAGNOSIS — E7800 Pure hypercholesterolemia, unspecified: Secondary | ICD-10-CM

## 2024-09-02 DIAGNOSIS — I10 Essential (primary) hypertension: Secondary | ICD-10-CM

## 2024-09-02 DIAGNOSIS — Z125 Encounter for screening for malignant neoplasm of prostate: Secondary | ICD-10-CM

## 2024-09-02 DIAGNOSIS — E669 Obesity, unspecified: Secondary | ICD-10-CM

## 2024-09-08 ENCOUNTER — Other Ambulatory Visit

## 2024-09-16 ENCOUNTER — Encounter: Admitting: Nurse Practitioner
